# Patient Record
Sex: Female | Born: 1945 | Race: White | Hispanic: No | Marital: Married
Health system: Southern US, Community
[De-identification: ages and names within clinical notes are randomized; demographics above are authoritative.]

## PROBLEM LIST (undated history)

## (undated) DIAGNOSIS — M545 Low back pain, unspecified: Secondary | ICD-10-CM

## (undated) DIAGNOSIS — E039 Hypothyroidism, unspecified: Secondary | ICD-10-CM

## (undated) DIAGNOSIS — F32A Depression, unspecified: Secondary | ICD-10-CM

## (undated) DIAGNOSIS — J90 Pleural effusion, not elsewhere classified: Secondary | ICD-10-CM

## (undated) DIAGNOSIS — J9600 Acute respiratory failure, unspecified whether with hypoxia or hypercapnia: Secondary | ICD-10-CM

## (undated) DIAGNOSIS — E785 Hyperlipidemia, unspecified: Secondary | ICD-10-CM

## (undated) DIAGNOSIS — F329 Major depressive disorder, single episode, unspecified: Secondary | ICD-10-CM

## (undated) DIAGNOSIS — K219 Gastro-esophageal reflux disease without esophagitis: Secondary | ICD-10-CM

## (undated) DIAGNOSIS — G8929 Other chronic pain: Secondary | ICD-10-CM

---

## 2016-01-04 ENCOUNTER — Inpatient Hospital Stay (HOSPITAL_COMMUNITY)
Admission: AD | Admit: 2016-01-04 | Discharge: 2016-01-14 | DRG: 207 | Disposition: E | Payer: Medicare Other | Source: Other Acute Inpatient Hospital | Attending: Pulmonary Disease | Admitting: Pulmonary Disease

## 2016-01-04 ENCOUNTER — Encounter (HOSPITAL_COMMUNITY): Payer: Self-pay | Admitting: Thoracic Surgery (Cardiothoracic Vascular Surgery)

## 2016-01-04 ENCOUNTER — Inpatient Hospital Stay (HOSPITAL_COMMUNITY): Payer: Medicare Other

## 2016-01-04 DIAGNOSIS — G9340 Encephalopathy, unspecified: Secondary | ICD-10-CM | POA: Diagnosis not present

## 2016-01-04 DIAGNOSIS — Z79899 Other long term (current) drug therapy: Secondary | ICD-10-CM | POA: Diagnosis not present

## 2016-01-04 DIAGNOSIS — Z938 Other artificial opening status: Secondary | ICD-10-CM | POA: Insufficient documentation

## 2016-01-04 DIAGNOSIS — R7881 Bacteremia: Secondary | ICD-10-CM

## 2016-01-04 DIAGNOSIS — G8929 Other chronic pain: Secondary | ICD-10-CM | POA: Diagnosis present

## 2016-01-04 DIAGNOSIS — J90 Pleural effusion, not elsewhere classified: Secondary | ICD-10-CM | POA: Diagnosis present

## 2016-01-04 DIAGNOSIS — F419 Anxiety disorder, unspecified: Secondary | ICD-10-CM | POA: Diagnosis present

## 2016-01-04 DIAGNOSIS — J869 Pyothorax without fistula: Secondary | ICD-10-CM | POA: Diagnosis present

## 2016-01-04 DIAGNOSIS — Z515 Encounter for palliative care: Secondary | ICD-10-CM | POA: Diagnosis present

## 2016-01-04 DIAGNOSIS — R21 Rash and other nonspecific skin eruption: Secondary | ICD-10-CM | POA: Diagnosis not present

## 2016-01-04 DIAGNOSIS — J80 Acute respiratory distress syndrome: Secondary | ICD-10-CM | POA: Diagnosis not present

## 2016-01-04 DIAGNOSIS — F0390 Unspecified dementia without behavioral disturbance: Secondary | ICD-10-CM | POA: Diagnosis present

## 2016-01-04 DIAGNOSIS — J9600 Acute respiratory failure, unspecified whether with hypoxia or hypercapnia: Secondary | ICD-10-CM | POA: Diagnosis not present

## 2016-01-04 DIAGNOSIS — E870 Hyperosmolality and hypernatremia: Secondary | ICD-10-CM | POA: Diagnosis not present

## 2016-01-04 DIAGNOSIS — K219 Gastro-esophageal reflux disease without esophagitis: Secondary | ICD-10-CM | POA: Diagnosis present

## 2016-01-04 DIAGNOSIS — Z880 Allergy status to penicillin: Secondary | ICD-10-CM

## 2016-01-04 DIAGNOSIS — J189 Pneumonia, unspecified organism: Secondary | ICD-10-CM | POA: Diagnosis present

## 2016-01-04 DIAGNOSIS — E878 Other disorders of electrolyte and fluid balance, not elsewhere classified: Secondary | ICD-10-CM | POA: Diagnosis not present

## 2016-01-04 DIAGNOSIS — E039 Hypothyroidism, unspecified: Secondary | ICD-10-CM | POA: Diagnosis present

## 2016-01-04 DIAGNOSIS — E876 Hypokalemia: Secondary | ICD-10-CM | POA: Diagnosis present

## 2016-01-04 DIAGNOSIS — E785 Hyperlipidemia, unspecified: Secondary | ICD-10-CM | POA: Diagnosis present

## 2016-01-04 DIAGNOSIS — R627 Adult failure to thrive: Secondary | ICD-10-CM | POA: Diagnosis present

## 2016-01-04 DIAGNOSIS — M545 Low back pain: Secondary | ICD-10-CM | POA: Diagnosis present

## 2016-01-04 DIAGNOSIS — Z66 Do not resuscitate: Secondary | ICD-10-CM | POA: Diagnosis present

## 2016-01-04 DIAGNOSIS — J948 Other specified pleural conditions: Secondary | ICD-10-CM | POA: Diagnosis not present

## 2016-01-04 DIAGNOSIS — F329 Major depressive disorder, single episode, unspecified: Secondary | ICD-10-CM | POA: Diagnosis present

## 2016-01-04 DIAGNOSIS — J9601 Acute respiratory failure with hypoxia: Secondary | ICD-10-CM | POA: Diagnosis not present

## 2016-01-04 DIAGNOSIS — D649 Anemia, unspecified: Secondary | ICD-10-CM | POA: Diagnosis present

## 2016-01-04 DIAGNOSIS — Z9289 Personal history of other medical treatment: Secondary | ICD-10-CM | POA: Insufficient documentation

## 2016-01-04 HISTORY — DX: Hyperlipidemia, unspecified: E78.5

## 2016-01-04 HISTORY — DX: Major depressive disorder, single episode, unspecified: F32.9

## 2016-01-04 HISTORY — DX: Pleural effusion, not elsewhere classified: J90

## 2016-01-04 HISTORY — DX: Low back pain, unspecified: M54.50

## 2016-01-04 HISTORY — DX: Low back pain: M54.5

## 2016-01-04 HISTORY — DX: Gastro-esophageal reflux disease without esophagitis: K21.9

## 2016-01-04 HISTORY — DX: Acute respiratory failure, unspecified whether with hypoxia or hypercapnia: J96.00

## 2016-01-04 HISTORY — DX: Other chronic pain: G89.29

## 2016-01-04 HISTORY — DX: Hypothyroidism, unspecified: E03.9

## 2016-01-04 HISTORY — DX: Depression, unspecified: F32.A

## 2016-01-04 LAB — CBC WITH DIFFERENTIAL/PLATELET
BASOS PCT: 0 %
Basophils Absolute: 0 10*3/uL (ref 0.0–0.1)
EOS PCT: 1 %
Eosinophils Absolute: 0.1 10*3/uL (ref 0.0–0.7)
HEMATOCRIT: 26.7 % — AB (ref 36.0–46.0)
Hemoglobin: 8.3 g/dL — ABNORMAL LOW (ref 12.0–15.0)
LYMPHS ABS: 1 10*3/uL (ref 0.7–4.0)
Lymphocytes Relative: 14 %
MCH: 28.3 pg (ref 26.0–34.0)
MCHC: 31.1 g/dL (ref 30.0–36.0)
MCV: 91.1 fL (ref 78.0–100.0)
MONO ABS: 1.2 10*3/uL — AB (ref 0.1–1.0)
MONOS PCT: 17 %
NEUTROS ABS: 4.7 10*3/uL (ref 1.7–7.7)
Neutrophils Relative %: 68 %
PLATELETS: 158 10*3/uL (ref 150–400)
RBC: 2.93 MIL/uL — ABNORMAL LOW (ref 3.87–5.11)
RDW: 14.8 % (ref 11.5–15.5)
WBC: 7 10*3/uL (ref 4.0–10.5)

## 2016-01-04 LAB — COMPREHENSIVE METABOLIC PANEL
ALK PHOS: 69 U/L (ref 38–126)
ALT: 12 U/L — ABNORMAL LOW (ref 14–54)
AST: 16 U/L (ref 15–41)
Albumin: 1.9 g/dL — ABNORMAL LOW (ref 3.5–5.0)
Anion gap: 8 (ref 5–15)
BILIRUBIN TOTAL: 0.5 mg/dL (ref 0.3–1.2)
BUN: 7 mg/dL (ref 6–20)
CALCIUM: 7.9 mg/dL — AB (ref 8.9–10.3)
CO2: 28 mmol/L (ref 22–32)
CREATININE: 0.71 mg/dL (ref 0.44–1.00)
Chloride: 105 mmol/L (ref 101–111)
GFR calc Af Amer: >60 mL/min (ref >60)
GLUCOSE: 96 mg/dL (ref 65–99)
POTASSIUM: 2.8 mmol/L — AB (ref 3.5–5.1)
Sodium: 141 mmol/L (ref 135–145)
TOTAL PROTEIN: 5.6 g/dL — AB (ref 6.5–8.1)

## 2016-01-04 LAB — PHOSPHORUS: Phosphorus: 3.6 mg/dL (ref 2.5–4.6)

## 2016-01-04 LAB — LIPASE, BLOOD: Lipase: 18 U/L (ref 11–51)

## 2016-01-04 LAB — TRIGLYCERIDES: Triglycerides: 108 mg/dL (ref <150)

## 2016-01-04 LAB — GLUCOSE, CAPILLARY
GLUCOSE-CAPILLARY: 99 mg/dL (ref 65–99)
GLUCOSE-CAPILLARY: 81 mg/dL (ref 65–99)

## 2016-01-04 LAB — POCT I-STAT 3, ART BLOOD GAS (G3+)
Acid-Base Excess: 5 mmol/L — ABNORMAL HIGH (ref 0.0–2.0)
Bicarbonate: 29.1 mEq/L — ABNORMAL HIGH (ref 20.0–24.0)
O2 SAT: 94 %
PCO2 ART: 38.8 mmHg (ref 35.0–45.0)
PH ART: 7.485 — AB (ref 7.350–7.450)
PO2 ART: 68 mmHg — AB (ref 80.0–100.0)
Patient temperature: 99.9
TCO2: 30 mmol/L (ref 0–100)

## 2016-01-04 LAB — TYPE AND SCREEN
ABO/RH(D): O POS
Antibody Screen: NEGATIVE

## 2016-01-04 LAB — CORTISOL: Cortisol, Plasma: 11.5 ug/dL

## 2016-01-04 LAB — STREP PNEUMONIAE URINARY ANTIGEN: STREP PNEUMO URINARY ANTIGEN: NEGATIVE

## 2016-01-04 LAB — PROTIME-INR
INR: 1.35 (ref 0.00–1.49)
Prothrombin Time: 16.8 seconds — ABNORMAL HIGH (ref 11.6–15.2)

## 2016-01-04 LAB — APTT: APTT: 49 s — AB (ref 24–37)

## 2016-01-04 LAB — ABO/RH: ABO/RH(D): O POS

## 2016-01-04 LAB — PROCALCITONIN: Procalcitonin: 1.01 ng/mL

## 2016-01-04 LAB — MRSA PCR SCREENING: MRSA by PCR: NEGATIVE

## 2016-01-04 LAB — MAGNESIUM: MAGNESIUM: 2 mg/dL (ref 1.7–2.4)

## 2016-01-04 MED ORDER — MIDAZOLAM HCL 2 MG/2ML IJ SOLN
1.0000 mg | INTRAMUSCULAR | Status: DC | PRN
  Administered 2016-01-05 (×2): 1 mg via INTRAVENOUS

## 2016-01-04 MED ORDER — LEVOTHYROXINE SODIUM 50 MCG PO TABS
50.0000 ug | ORAL_TABLET | Freq: Every day | ORAL | Status: DC
  Administered 2016-01-05 – 2016-01-10 (×6): 50 ug
  Filled 2016-01-04 (×6): qty 1

## 2016-01-04 MED ORDER — FAMOTIDINE IN NACL 20-0.9 MG/50ML-% IV SOLN
20.0000 mg | Freq: Two times a day (BID) | INTRAVENOUS | Status: DC
  Administered 2016-01-04 – 2016-01-09 (×11): 20 mg via INTRAVENOUS
  Filled 2016-01-04 (×13): qty 50

## 2016-01-04 MED ORDER — SERTRALINE HCL 100 MG PO TABS
100.0000 mg | ORAL_TABLET | Freq: Every day | ORAL | Status: DC
  Administered 2016-01-05 – 2016-01-10 (×6): 100 mg
  Filled 2016-01-04 (×6): qty 1

## 2016-01-04 MED ORDER — ANTISEPTIC ORAL RINSE SOLUTION (CORINZ)
7.0000 mL | Freq: Four times a day (QID) | OROMUCOSAL | Status: DC
  Administered 2016-01-05 – 2016-01-06 (×6): 7 mL via OROMUCOSAL

## 2016-01-04 MED ORDER — VANCOMYCIN HCL IN DEXTROSE 750-5 MG/150ML-% IV SOLN
750.0000 mg | Freq: Two times a day (BID) | INTRAVENOUS | Status: DC
  Filled 2016-01-04 (×2): qty 150

## 2016-01-04 MED ORDER — ANTISEPTIC ORAL RINSE SOLUTION (CORINZ)
7.0000 mL | Freq: Four times a day (QID) | OROMUCOSAL | Status: DC
  Administered 2016-01-04: 7 mL via OROMUCOSAL

## 2016-01-04 MED ORDER — FENTANYL CITRATE (PF) 100 MCG/2ML IJ SOLN
50.0000 ug | INTRAMUSCULAR | Status: DC | PRN
  Administered 2016-01-05 – 2016-01-10 (×7): 50 ug via INTRAVENOUS
  Filled 2016-01-04 (×8): qty 2

## 2016-01-04 MED ORDER — ALBUTEROL SULFATE (2.5 MG/3ML) 0.083% IN NEBU
2.5000 mg | INHALATION_SOLUTION | RESPIRATORY_TRACT | Status: DC
  Administered 2016-01-04 – 2016-01-10 (×35): 2.5 mg via RESPIRATORY_TRACT
  Filled 2016-01-04 (×35): qty 3

## 2016-01-04 MED ORDER — INSULIN ASPART 100 UNIT/ML ~~LOC~~ SOLN
0.0000 [IU] | SUBCUTANEOUS | Status: DC
  Administered 2016-01-06 – 2016-01-09 (×13): 1 [IU] via SUBCUTANEOUS

## 2016-01-04 MED ORDER — MIDAZOLAM HCL 2 MG/2ML IJ SOLN
1.0000 mg | INTRAMUSCULAR | Status: DC | PRN
Start: 2016-01-04 — End: 2016-01-10
  Administered 2016-01-07: 1 mg via INTRAVENOUS
  Filled 2016-01-04 (×2): qty 2

## 2016-01-04 MED ORDER — FENTANYL CITRATE (PF) 100 MCG/2ML IJ SOLN
50.0000 ug | INTRAMUSCULAR | Status: AC | PRN
  Administered 2016-01-05 (×3): 50 ug via INTRAVENOUS
  Filled 2016-01-04: qty 2

## 2016-01-04 MED ORDER — VITAL HIGH PROTEIN PO LIQD
1000.0000 mL | ORAL | Status: DC
  Administered 2016-01-04: 1000 mL

## 2016-01-04 MED ORDER — VANCOMYCIN HCL IN DEXTROSE 750-5 MG/150ML-% IV SOLN
750.0000 mg | Freq: Two times a day (BID) | INTRAVENOUS | Status: DC
  Administered 2016-01-04: 750 mg via INTRAVENOUS
  Filled 2016-01-04 (×3): qty 150

## 2016-01-04 MED ORDER — PRO-STAT SUGAR FREE PO LIQD
30.0000 mL | Freq: Two times a day (BID) | ORAL | Status: DC
  Administered 2016-01-04 – 2016-01-10 (×13): 30 mL
  Filled 2016-01-04 (×12): qty 30

## 2016-01-04 MED ORDER — POTASSIUM CHLORIDE 10 MEQ/50ML IV SOLN
10.0000 meq | INTRAVENOUS | Status: AC
  Administered 2016-01-04 – 2016-01-05 (×6): 10 meq via INTRAVENOUS
  Filled 2016-01-04 (×6): qty 50

## 2016-01-04 MED ORDER — CHLORHEXIDINE GLUCONATE 0.12% ORAL RINSE (MEDLINE KIT)
15.0000 mL | Freq: Two times a day (BID) | OROMUCOSAL | Status: DC
  Administered 2016-01-04 – 2016-01-06 (×4): 15 mL via OROMUCOSAL

## 2016-01-04 MED ORDER — HEPARIN SODIUM (PORCINE) 5000 UNIT/ML IJ SOLN
5000.0000 [IU] | Freq: Three times a day (TID) | INTRAMUSCULAR | Status: DC
  Filled 2016-01-04: qty 1

## 2016-01-04 MED ORDER — PROPOFOL 1000 MG/100ML IV EMUL
0.0000 ug/kg/min | INTRAVENOUS | Status: DC
  Administered 2016-01-04: 35 ug/kg/min via INTRAVENOUS
  Administered 2016-01-04: 30 ug/kg/min via INTRAVENOUS
  Administered 2016-01-05 (×2): 20 ug/kg/min via INTRAVENOUS
  Administered 2016-01-05: 30 ug/kg/min via INTRAVENOUS
  Administered 2016-01-06: 40 ug/kg/min via INTRAVENOUS
  Administered 2016-01-06: 30 ug/kg/min via INTRAVENOUS
  Administered 2016-01-06 – 2016-01-07 (×3): 20 ug/kg/min via INTRAVENOUS
  Filled 2016-01-04 (×11): qty 100

## 2016-01-04 MED ORDER — SODIUM CHLORIDE 0.9 % IV SOLN
500.0000 mg | Freq: Three times a day (TID) | INTRAVENOUS | Status: DC
  Administered 2016-01-04 – 2016-01-05 (×2): 500 mg via INTRAVENOUS
  Filled 2016-01-04 (×4): qty 500

## 2016-01-04 MED ORDER — SODIUM CHLORIDE 0.9 % IV SOLN
250.0000 mL | INTRAVENOUS | Status: DC | PRN
  Administered 2016-01-06: 250 mL via INTRAVENOUS

## 2016-01-04 MED ORDER — CHLORHEXIDINE GLUCONATE 0.12% ORAL RINSE (MEDLINE KIT): 15.0000 mL | Freq: Two times a day (BID) | OROMUCOSAL | Status: DC

## 2016-01-04 MED ORDER — SODIUM CHLORIDE 0.9 % IV SOLN
INTRAVENOUS | Status: DC
  Administered 2016-01-04 – 2016-01-05 (×2): via INTRAVENOUS
  Administered 2016-01-06: 75 mL/h via INTRAVENOUS

## 2016-01-04 NOTE — Progress Notes (Addendum)
Pharmacy Antibiotic Note  Breanna [name] is a 70 y.o. female  transferred on 01/05/2016 with pneumonia.  Pharmacy has been consulted for Vancomycin and Primaxin dosing.  70 year old female admitted to [facility] 6/16 with SOB, cough, congestion.  Had been at home for 3 days with these symptoms.  History of Gerd, HLD, depression, hypothyroidism  At [facility], she was initially treated with Rocephin and Zithromax with worsening of her condition.  Her condition worsened and was transitioned to Levaquin, Meropenem, and Vancomycin.  She was placed on bipap then intubated and transferred to [facility] 6/21  Vancomycin 1250 mg iv X 1 dose given 6/20 at 12 noon, then placed on Vancomycin 1 gram iv Q 18 hours (last dose 6/21 at 6 am) Last doses of Meropenem and Levaquin 6/20 pm   Last Scr at [facility] = 0.7  Plan: Increase Vancomycin to 750 mg iv Q 12 hours (next dose at 2200 pm) Primaxin 500 mg iv Q 8 hours Follow up progress, Scr, culture results  Height: 5\' 7"  (170.2 cm) Weight: 195 lb 15.8 oz (88.9 kg) IBW/kg (Calculated) : 61.6  Temp (24hrs), Avg:99.9 F (37.7 C), Min:99.9 F (37.7 C), Max:99.9 F (37.7 C)  No results for input(s): WBC, CREATININE, LATICACIDVEN, VANCOTROUGH, VANCOPEAK, VANCORANDOM, GENTTROUGH, GENTPEAK, GENTRANDOM, TOBRATROUGH, TOBRAPEAK, TOBRARND, AMIKACINPEAK, AMIKACINTROU, AMIKACIN in the last 168 hours.  CrCl cannot be calculated (Patient has no serum creatinine result on file.).    Allergies  Allergen Reactions  . Penicillins      Thank you Okey RegalLisa Shernell Saldierna, PharmD [phone]  12/17/2015 4:16 PM

## 2016-01-04 NOTE — Progress Notes (Signed)
50cc of fentanyl drip wasted in sink from [facility]. Witnessed by Constellation BrandsChristian RN.

## 2016-01-04 NOTE — H&P (Signed)
PULMONARY / CRITICAL CARE MEDICINE   Name: Breanna [name] MRN: [id number] DOB: 04/11/1946    ADMISSION DATE:  12/19/2015 CONSULTATION DATE:  01/11/2016  CHIEF COMPLAINT:  Treatment for progressing acute respiratory failure, pneumonia and empyema.  HISTORY OF PRESENT ILLNESS:   70 yo woman with history of GERD, hyperlipidemia, chronic low back pain, hypothyroidism and depression. She was originally seen outpatient on 12/30/15 for severe fatigue, shortness of breath, difficulty taking a deep breath and cough and congestion. She was admitted on 6/16 for inpatient treatment at [facility] and initially treated with Rocephin and Zithromax, however condition continued to worsen. Antibiotics were then changed to Levaquin, Vancomycin and Meropenum. Condition continue to worsen and she required BiPap and transfer to the ICU. Once in the ICU she was then intubated and transferred to [facility] for further treatment and workup.   PAST MEDICAL HISTORY :  She  has no past medical history on file.  PAST SURGICAL HISTORY: She  has no past surgical history on file.  Allergies  Allergen Reactions  . Penicillins     No current facility-administered medications on file prior to encounter.   No current outpatient prescriptions on file prior to encounter.    FAMILY HISTORY:  Her has no family status information on file.   SOCIAL HISTORY: She    REVIEW OF SYSTEMS:   Negative except HPI.   SUBJECTIVE:  Sedated on vent VITAL SIGNS: BP 136/71 mmHg  Pulse 76  Temp(Src) 99.9 F (37.7 C) (Oral)  Resp 26  Ht 5\' 7"  (1.702 m)  Wt 195 lb 15.8 oz (88.9 kg)  BMI 30.69 kg/m2  SpO2 93%  HEMODYNAMICS:    VENTILATOR SETTINGS: Vent Mode:  [-] PRVC FiO2 (%):  [60 %] 60 % Set Rate:  [20 bmp-26 bmp] 20 bmp Vt Set:  [500 mL] 500 mL PEEP:  [5 cmH20-10 cmH20] 10 cmH20 Plateau Pressure:  [25 cmH20] 25 cmH20  INTAKE / OUTPUT:    PHYSICAL EXAMINATION: General:  Ill looking elderly female Neuro:   Sedated HEENT: Moist mucous membranes Cardiovascular:  No JVD, S1S2, no murmurs Lungs:  Rhonchi throughout, diminished breath sounds in bases L>R, Intubated Abdomen: Obese, BS x 4 quadrants, OGT in place Musculoskeletal:  Sedated, no moving to command Skin: warm and intact  LABS:  BMET No results for input(s): NA, K, CL, CO2, BUN, CREATININE, GLUCOSE in the last 168 hours.  Electrolytes No results for input(s): CALCIUM, MG, PHOS in the last 168 hours.  CBC No results for input(s): WBC, HGB, HCT, PLT in the last 168 hours.  Coag's No results for input(s): APTT, INR in the last 168 hours.  Sepsis Markers No results for input(s): LATICACIDVEN, PROCALCITON, O2SATVEN in the last 168 hours.  ABG  Recent Labs Lab 01/01/2016 1542  PHART 7.485*  PCO2ART 38.8  PO2ART 68.0*    Liver Enzymes No results for input(s): AST, ALT, ALKPHOS, BILITOT, ALBUMIN in the last 168 hours.  Cardiac Enzymes No results for input(s): TROPONINI, PROBNP in the last 168 hours.  Glucose No results for input(s): GLUCAP in the last 168 hours.  Imaging No results found.   STUDIES:  CT chest OSH - multifocal pneumonia with possible early empyema on the left side of the chest.  CT abd/Pelvis OSH: no abnormalities V/Q scan OSH: intermediate probability of PE  CULTURES: None  ANTIBIOTICS: Levaquin 500 mg IV daily 6/19 >>> 6/21 Vancomycin 1 gram (per pharmacy dosing) 6/20 >>> Meropenum 1 gram IV q 12 6/19 >>> 6/21 Imipenem  6/21 >>.  SIGNIFICANT EVENTS: January 19, 2016: [facility] [place] [facility]   LINES/TUBES: ETT Foley  DISCUSSION: 70 yo woman with history of GERD, hyperlipidemia, chronic low back pain, hypothyroidism and depression. Admitted to [facility] for evaluation and treatment of empyema and pneumonia.   ASSESSMENT / PLAN:  PULMONARY A: Empyema Acute Respiratory Failure Pneumonia P:   Continue abx  Possible chest tube placement for drainage of empyema versus  VATS If CT placed may be DNase/TPA candidate  CXR/ABG in the AM VAP bundle Wean vent as tolerated  CARDIOVASCULAR A:  Hyperlipidemia P:  Atorvastatin 40 mg nightly - restart once enteral nutrition started Maintain MAP > 65  RENAL A:   Foley in place P:   Continue to monitor UOP Goal euvolemia  GASTROINTESTINAL A:   GERD P:   Pepcid for GI prophylaxis TF per nutrition    HEMATOLOGIC A:   Anemia DVT prophylaxis P:  SCD for DVT prophylaxis Holding chemoprophylaxis at this time (CT versus surgery) Holding Iron in the setting of critical illness Further anemia workup when stable  INFECTIOUS A:   Leukocytosis Empyema P:   Stop Levaquin (received 5 days of atypical coverage) Continue Vancomycin  Start Imipenem Monitor WBC and fever curve F/U strep antigen F/U sputum culture/gram stain  ENDOCRINE A:   Blood glucose monitoring P:   Add SSI CBG Q 4 hours    NEUROLOGIC A:   Sedated P:   RASS goal: -1 Fentanyl/Versed prn Propofol gtt   FAMILY  - Updates:   - Inter-disciplinary family meet or Palliative Care meeting due by:  01/11/16   [name] ACNP-BC [facility] Pulmonary/Critical Care Pager # [phone] OR # [phone] if no answer   01/19/2016, 4:12 PM   Attending Note:  70 year old female with extensive PMH who presents to [facility] with SOB and was noted to have a left sided pneumonia. Patient subsequently was intubated for hypoxemic respiratory failure. Over the following days the patient developed pleural effusion and failed to respond. Was transferred to [facility] for pulmonary to evaluate. The patient is currently sedated, unresponsive on propofol. Diminished BS on the left with dullness to percussion. Will recheck ABG now and adjust vent. Continue primaxin and vanc, already had 5 days in zithromax. F/u on culture. Consult CVTS, ?VATS vs bedside CT as when I reviewed the CT myself there is loculation. Will keep on propofol today and  change to versed tomorrow after decision is made on surgery for VATS or not.  The patient is critically ill with multiple organ systems failure and requires high complexity decision making for assessment and support, frequent evaluation and titration of therapies, application of advanced monitoring technologies and extensive interpretation of multiple databases.   Critical Care Time devoted to patient care services described in this note is 35 Minutes. This time reflects time of care of this signee Dr Koren Bound. This critical care time does not reflect procedure time, or teaching time or supervisory time of PA/NP/Med student/Med Resident etc but could involve care discussion time.  Alyson Reedy, M.D. [facility] Pulmonary/Critical Care Medicine. Pager: [phone]. After hours pager: [phone].

## 2016-01-04 NOTE — Progress Notes (Signed)
eLink Physician-Brief Progress Note Patient Name: Breanna [name] DOB: 1946-02-07 MRN: [id number]   Date of Service  01/01/2016  HPI/Events of Note  K+ = 2.8 and Creatinine = 0.71.  eICU Interventions  Will replace K+.     Intervention Category Intermediate Interventions: Electrolyte abnormality - evaluation and management  Lenell AntuSommer,Jaeceon Michelin Eugene 12/25/2015, 7:36 PM

## 2016-01-04 NOTE — Consult Note (Addendum)
[address]       [place] [address]             [phone]          CARDIOTHORACIC SURGERY CONSULTATION REPORT  PCP is Neale Burly, MD Referring Provider is Rush Farmer, MD  Reason for consultation:  Left pleural effusion  HPI:  Patient is a 70 year old obese white female with reported history of GE reflux disease, hyperlipidemia, chronic low back pain, hypothyroidism, and depression. A comprehensive review of the patient's past medical history is not currently possible as records are incomplete. By report the patient was extended to [facility] 12/30/2015 with complaints of fatigue, shortness of breath, difficulty taking a deep breath, cough, and congestion. She was initially treated with intravenous Rocephin and Zithromax but her condition deteriorated. Antibiotics were broadened to include Levaquin, vancomycin, and meropenem. A chest x-ray and CT scan were performed that revealed severe bilateral pneumonia with moderate sized left pleural effusion. She developed acute respiratory failure requiring mechanical ventilatory support and was transferred to [facility] for further management.  She was evaluated by Dr. Nelda Marseille and cardiothoracic surgical consultation was requested to consider chest tube placement versus VATS.  Upon my arrival the patient is intubated and heavily sedated on ventilator. The patient has been made DO NOT RESUSCITATE.  The patient's family recently left for the evening.    Past Medical History  Diagnosis Date  . Pleural effusion, left   . Acute respiratory failure ([place]) 12/20/2015    No past surgical history on file.  No family history on file.  Social History   Social History  . Marital Status: Married    Spouse Name: N/A  . Number of Children: N/A  . Years of Education: N/A   Occupational History  . Not on file.   Social History Main Topics  . Smoking status: Not on file  . Smokeless tobacco: Not  on file  . Alcohol Use: Not on file  . Drug Use: Not on file  . Sexual Activity: Not on file   Other Topics Concern  . Not on file   Social History Narrative  . No narrative on file    Prior to Admission medications   Medication Sig Start Date End Date Taking? Authorizing Provider  atorvastatin (LIPITOR) 40 MG tablet Take 40 mg by mouth daily at 6 PM.    Historical Provider, MD  gabapentin (NEURONTIN) 300 MG capsule Take 300 mg by mouth 2 (two) times daily.    Historical Provider, MD  HYDROcodone-acetaminophen (NORCO) 7.5-325 MG tablet Take 1 tablet by mouth every 6 (six) hours as needed for moderate pain.    Historical Provider, MD  lansoprazole (PREVACID) 15 MG capsule Take 15 mg by mouth 2 (two) times daily before a meal.    Historical Provider, MD  levothyroxine (SYNTHROID, LEVOTHROID) 50 MCG tablet Take 50 mcg by mouth daily before breakfast.    Historical Provider, MD  sertraline (ZOLOFT) 100 MG tablet Take 100 mg by mouth daily.    Historical Provider, MD    Current Facility-Administered Medications  Medication Dose Route Frequency Provider Last Rate Last Dose  . 0.9 %  sodium chloride infusion  250 mL Intravenous PRN Erick Colace, NP      . 0.9 %  sodium chloride infusion   Intravenous Continuous Erick Colace, NP 75 mL/hr at 01/02/2016 1554    . albuterol (PROVENTIL) (2.5 MG/3ML) 0.083% nebulizer solution  2.5 mg  2.5 mg Nebulization Q4H Erick Colace, NP   2.5 mg at 12/17/2015 1625  . antiseptic oral rinse solution (CORINZ)  7 mL Mouth Rinse QID Erick Colace, NP   0 mL at 12/26/2015 1600  . chlorhexidine gluconate (SAGE KIT) (PERIDEX) 0.12 % solution 15 mL  15 mL Mouth Rinse BID Erick Colace, NP      . famotidine (PEPCID) IVPB 20 mg premix  20 mg Intravenous Q12H Erick Colace, NP      . feeding supplement (PRO-STAT SUGAR FREE 64) liquid 30 mL  30 mL Per Tube BID Erick Colace, NP      . feeding supplement (VITAL HIGH PROTEIN) liquid 1,000 mL  1,000 mL Per Tube Q24H  Erick Colace, NP      . fentaNYL (SUBLIMAZE) injection 50 mcg  50 mcg Intravenous Q15 min PRN Erick Colace, NP      . fentaNYL (SUBLIMAZE) injection 50 mcg  50 mcg Intravenous Q2H PRN Erick Colace, NP      . imipenem-cilastatin (PRIMAXIN) 500 mg in sodium chloride 0.9 % 100 mL IVPB  500 mg Intravenous Q8H Rush Farmer, MD   500 mg at 01/05/2016 1803  . insulin aspart (novoLOG) injection 0-9 Units  0-9 Units Subcutaneous Q4H Erick Colace, NP   0 Units at 01/01/2016 1600  . [START ON 01/05/2016] levothyroxine (SYNTHROID, LEVOTHROID) tablet 50 mcg  50 mcg Per Tube QAC breakfast Erick Colace, NP      . midazolam (VERSED) injection 1 mg  1 mg Intravenous Q15 min PRN Awilda Bill, NP      . midazolam (VERSED) injection 1 mg  1 mg Intravenous Q2H PRN Awilda Bill, NP      . propofol (DIPRIVAN) 1000 MG/100ML infusion  0-50 mcg/kg/min Intravenous Continuous Erick Colace, NP 18.7 mL/hr at 01/05/2016 1809 35 mcg/kg/min at 12/16/2015 1809  . [START ON 01/05/2016] sertraline (ZOLOFT) tablet 100 mg  100 mg Per Tube Daily Erick Colace, NP      . vancomycin (VANCOCIN) IVPB 750 mg/150 ml premix  750 mg Intravenous Q12H Rush Farmer, MD        Allergies  Allergen Reactions  . Penicillins       Review of Systems:  Not obtainable    Physical Exam:   BP 105/52 mmHg  Pulse 58  Temp(Src) 99.9 F (37.7 C) (Oral)  Resp 20  Ht _0  (1.702 m)  Wt 195 lb 15.8 oz (88.9 kg)  BMI 30.69 kg/m2  SpO2 97%  General:  Obese white female sedated on ventilator  HEENT:  Unremarkable   Neck:   no JVD, no bruits, no adenopathy   Chest:   Coarse rhonchi both lungs  CV:   RRR, no murmur   Abdomen:  soft, non-tender, no masses   Extremities:  warm, well-perfused, pulses diminished, adequate perfusion  Rectal/GU  Deferred  Neuro:   Grossly non-focal and symmetrical throughout  Skin:   Clean and dry, no rashes, no breakdown  Diagnostic Tests:  PORTABLE CHEST 1 VIEW  COMPARISON:  None.  FINDINGS: Endotracheal tube terminates 4.2 cm above carina.Nasogastric terminates at the body of the stomach. Left internal jugular line tip at mid SVC.  Mild cardiomegaly. Moderate left-sided pleural effusion. Mild right hemidiaphragm elevation. No pneumothorax. Patchy interstitial and airspace disease bilaterally. More dense consolidation in the left lower lobe.  IMPRESSION: Appropriate position of endotracheal, nasogastric, and left internal jugular lines,  without pneumothorax.  Moderate left pleural effusion with adjacent atelectasis or infection. Followup PA and lateral chest X-ray is recommended in 3-4 weeks following trial of antibiotic therapy to ensure resolution and exclude underlying malignancy.  Other patchy interstitial and airspace opacities which could represent pulmonary edema and/or infection.   Electronically Signed  By: Abigail Miyamoto M.D.  On: 12/26/2015 17:25    Impression:  I have personally reviewed the chest CT scan performed that [facility] which reveals severe diffuse bilateral pneumonia and a small to moderate sized left pleural effusion.  There are no obvious loculations visible on this CT scan.  Although the patient would likely benefit from drainage of the pleural effusion, the effusion is not very large and clearly not the primary cause of the patient's respiratory failure.    Plan:  The patient's family recently left the hospital and are not currently available for consultation.  At this point I would favor bedside chest tube placement rather than taking this patient for surgery if the effusion is to be drained.  I attempted to page both Dr. Nelda Marseille and Marni Griffon to inquire regarding the patient's past medical history, CODE STATUS and goals for treatment.  No answer.  Will follow up in am tomorrow.   I spent in excess of 30 minutes during the conduct of this hospital consultation and >50% of this time involved direct  face-to-face encounter for counseling and/or coordination of the patient's care.   Valentina Gu. Roxy Manns, MD 01/08/2016 6:12 PM

## 2016-01-04 NOTE — Procedures (Signed)
Central Venous Catheter Insertion Procedure Note Breanna KosMary [name] [id number] [birth year]  Procedure: Insertion of Central Venous Catheter Indications: Assessment of intravascular volume and Drug and/or fluid administration  Procedure Details Consent: Unable to obtain consent because of emergent medical necessity. Time Out: Verified patient identification, verified procedure, site/side was marked, verified correct patient position, special equipment/implants available, medications/allergies/relevent history reviewed, required imaging and test results available.  Performed  Maximum sterile technique was used including antiseptics, cap, gloves, gown, hand hygiene, mask and sheet. Skin prep: Chlorhexidine; local anesthetic administered A antimicrobial bonded/coated triple lumen catheter was placed in the left internal jugular vein using the Seldinger technique.  Evaluation Blood flow good Complications: No apparent complications Patient did tolerate procedure well. Chest X-ray ordered to verify placement.  CXR: pending.  U/S used in placement.  YACOUB,WESAM 07/26/15, 4:49 PM

## 2016-01-05 ENCOUNTER — Encounter (HOSPITAL_COMMUNITY): Payer: Self-pay

## 2016-01-05 ENCOUNTER — Inpatient Hospital Stay (HOSPITAL_COMMUNITY): Payer: Medicare Other

## 2016-01-05 DIAGNOSIS — Z938 Other artificial opening status: Secondary | ICD-10-CM | POA: Insufficient documentation

## 2016-01-05 DIAGNOSIS — J948 Other specified pleural conditions: Secondary | ICD-10-CM

## 2016-01-05 DIAGNOSIS — J189 Pneumonia, unspecified organism: Secondary | ICD-10-CM

## 2016-01-05 LAB — MAGNESIUM
MAGNESIUM: 2.1 mg/dL (ref 1.7–2.4)
MAGNESIUM: 2.1 mg/dL (ref 1.7–2.4)

## 2016-01-05 LAB — PROCALCITONIN: PROCALCITONIN: 0.74 ng/mL

## 2016-01-05 LAB — BLOOD GAS, ARTERIAL
Acid-Base Excess: 4.1 mmol/L — ABNORMAL HIGH (ref 0.0–2.0)
Bicarbonate: 27.9 mEq/L — ABNORMAL HIGH (ref 20.0–24.0)
DRAWN BY: 36496
FIO2: 0.6
O2 SAT: 96 %
PATIENT TEMPERATURE: 98.6
PCO2 ART: 40.1 mmHg (ref 35.0–45.0)
PEEP: 10 cmH2O
PH ART: 7.456 — AB (ref 7.350–7.450)
PO2 ART: 81.4 mmHg (ref 80.0–100.0)
RATE: 20 resp/min
TCO2: 29.1 mmol/L (ref 0–100)
VT: 500 mL

## 2016-01-05 LAB — BASIC METABOLIC PANEL
Anion gap: 5 (ref 5–15)
BUN: 12 mg/dL (ref 6–20)
CALCIUM: 8 mg/dL — AB (ref 8.9–10.3)
CHLORIDE: 108 mmol/L (ref 101–111)
CO2: 28 mmol/L (ref 22–32)
CREATININE: 0.62 mg/dL (ref 0.44–1.00)
GFR calc non Af Amer: >60 mL/min (ref >60)
Glucose, Bld: 121 mg/dL — ABNORMAL HIGH (ref 65–99)
Potassium: 3.3 mmol/L — ABNORMAL LOW (ref 3.5–5.1)
Sodium: 141 mmol/L (ref 135–145)

## 2016-01-05 LAB — GLUCOSE, CAPILLARY
GLUCOSE-CAPILLARY: 102 mg/dL — AB (ref 65–99)
GLUCOSE-CAPILLARY: 107 mg/dL — AB (ref 65–99)
GLUCOSE-CAPILLARY: 115 mg/dL — AB (ref 65–99)
GLUCOSE-CAPILLARY: 93 mg/dL (ref 65–99)
Glucose-Capillary: 116 mg/dL — ABNORMAL HIGH (ref 65–99)
Glucose-Capillary: 106 mg/dL — ABNORMAL HIGH (ref 65–99)

## 2016-01-05 LAB — CBC
HEMATOCRIT: 27.2 % — AB (ref 36.0–46.0)
HEMOGLOBIN: 8.2 g/dL — AB (ref 12.0–15.0)
MCH: 27.2 pg (ref 26.0–34.0)
MCHC: 30.1 g/dL (ref 30.0–36.0)
MCV: 90.4 fL (ref 78.0–100.0)
Platelets: 155 10*3/uL (ref 150–400)
RBC: 3.01 MIL/uL — ABNORMAL LOW (ref 3.87–5.11)
RDW: 14.9 % (ref 11.5–15.5)
WBC: 6.8 10*3/uL (ref 4.0–10.5)

## 2016-01-05 LAB — URINE CULTURE: CULTURE: NO GROWTH

## 2016-01-05 LAB — PATHOLOGIST SMEAR REVIEW

## 2016-01-05 LAB — PHOSPHORUS
PHOSPHORUS: 2.9 mg/dL (ref 2.5–4.6)
PHOSPHORUS: 3.2 mg/dL (ref 2.5–4.6)

## 2016-01-05 LAB — PROTIME-INR
INR: 1.32 (ref 0.00–1.49)
Prothrombin Time: 16.5 seconds — ABNORMAL HIGH (ref 11.6–15.2)

## 2016-01-05 LAB — APTT: APTT: 28 s (ref 24–37)

## 2016-01-05 MED ORDER — LIDOCAINE HCL (PF) 1 % IJ SOLN
INTRAMUSCULAR | Status: AC
  Administered 2016-01-05: 14:00:00
  Filled 2016-01-05: qty 5

## 2016-01-05 MED ORDER — POTASSIUM CHLORIDE 20 MEQ/15ML (10%) PO SOLN
40.0000 meq | Freq: Once | ORAL | Status: AC
  Administered 2016-01-05: 40 meq
  Filled 2016-01-05: qty 30

## 2016-01-05 MED ORDER — SODIUM CHLORIDE 0.9 % IV SOLN
500.0000 mg | Freq: Four times a day (QID) | INTRAVENOUS | Status: DC
  Administered 2016-01-05 – 2016-01-09 (×16): 500 mg via INTRAVENOUS
  Filled 2016-01-05 (×17): qty 500

## 2016-01-05 MED ORDER — VITAL HIGH PROTEIN PO LIQD
1000.0000 mL | ORAL | Status: DC
  Administered 2016-01-05: 1000 mL
  Administered 2016-01-07 (×4)
  Administered 2016-01-07 (×2): 1000 mL
  Administered 2016-01-07: 19:00:00
  Administered 2016-01-08: 1000 mL
  Administered 2016-01-08 (×3)
  Administered 2016-01-09: 1000 mL
  Filled 2016-01-05: qty 1000

## 2016-01-05 MED ORDER — LIDOCAINE HCL (PF) 1 % IJ SOLN
INTRAMUSCULAR | Status: AC
  Administered 2016-01-05: 14:00:00
  Filled 2016-01-05: qty 10

## 2016-01-05 MED ORDER — VANCOMYCIN HCL IN DEXTROSE 1-5 GM/200ML-% IV SOLN
1000.0000 mg | Freq: Two times a day (BID) | INTRAVENOUS | Status: DC
  Administered 2016-01-05 – 2016-01-08 (×8): 1000 mg via INTRAVENOUS
  Filled 2016-01-05 (×9): qty 200

## 2016-01-05 MED ORDER — ATORVASTATIN CALCIUM 40 MG PO TABS
40.0000 mg | ORAL_TABLET | Freq: Every day | ORAL | Status: DC
  Administered 2016-01-05 – 2016-01-09 (×5): 40 mg via ORAL
  Filled 2016-01-05 (×5): qty 1

## 2016-01-05 NOTE — Progress Notes (Addendum)
Pharmacy Antibiotic Note  Breanna [name] is a 70 y.o. female  transferred on 12/27/2015 with empyema and pneumonia.  Pharmacy has been consulted for Vancomycin and Primaxin dosing.  PMH GERD, HLD, depression, hypothyroidism  Minimal clinical improvement on ceftriaxone and [facility]ithromycin at [facility], transitioned to levofloxacin, meropenem, and vancomycin prior to [facility]. Continuing vancomycin and imipenem during this admission.  WBC wnl, afebrile. Chest tube to be placed for empyema drainage. Cr 0.62, CrCl ~75 ml/min. Adjusting abx to ensure optimal dosing.   Plan: Increase vancomycin to 1000 mg q12h Increase imipenem to 500 mg q6h Monitor renal function, s/sx clinical improvement F/u cx results, VT at steady state or as indicated  Height: 5\' 7"  (170.2 cm) Weight: 197 lb 8.5 oz (89.6 kg) IBW/kg (Calculated) : 61.6  Temp (24hrs), Avg:99.2 F (37.3 C), Min:97.7 F (36.5 C), Max:100 F (37.8 C)   Recent Labs Lab 01/01/2016 1805 01/05/16 0425  WBC 7.0 6.8  CREATININE 0.71 0.62    Estimated Creatinine Clearance: 75.2 mL/min (by C-G formula based on Cr of 0.62).    Allergies  Allergen Reactions  . Penicillins     Has patient had a PCN reaction causing immediate rash, facial/tongue/throat swelling, SOB or lightheadedness with hypotension: YES Has patient had a PCN reaction causing severe rash involving mucus membranes or skin necrosis: NO Has patient had a PCN reaction that required hospitalization NO Has patient had a PCN reaction occurring within the last 10 years: NO If all of the above answers are "NO", then may proceed with Cephalosporin use.      Hillery AldoElizabeth Armany Mano, VermontPharm.D., BCPS PGY2 Cardiology Pharmacy Resident Pager: [phone] 01/05/2016 1:07 PM

## 2016-01-05 NOTE — Progress Notes (Signed)
TCTS BRIEF PROGRESS NOTE   Plans for chest tube placement discussed with Dr. Molli KnockYacoub.  Please call if we can be of further assistance.  Purcell Nailslarence H Boyce Keltner, MD 01/05/2016 11:51 AM

## 2016-01-05 NOTE — Procedures (Signed)
Chest Tube Insertion Procedure Note  Indications:  Clinically significant Parapneumonic Effusion  Pre-operative Diagnosis: Effusion  Post-operative Diagnosis: Effusion  Procedure Details  Informed consent was obtained for the procedure, including sedation.  Risks of lung perforation, hemorrhage, arrhythmia, and adverse drug reaction were discussed.   After sterile skin prep, using standard technique, a 28 French tube was placed in the left lateral 5th  rib space.  Findings: 250 ml of serosanguinous fluid obtained  Estimated Blood Loss:  Minimal         Specimens:  None              Complications:  None; patient tolerated the procedure well.         Disposition: ICU - intubated and critically ill.         Condition: stable   Canary BrimBrandi Ollis, NP-C [place] Pulmonary & Critical Care Pgr: [phone] or if no answer [phone] 01/05/2016, 2:14 PM  Alyson ReedyWesam G. Yacoub, M.D. [facility] Pulmonary/Critical Care Medicine. Pager: [phone]. After hours pager: [phone].

## 2016-01-05 NOTE — Progress Notes (Signed)
PULMONARY / CRITICAL CARE MEDICINE   Name: Breanna [name] MRN: [id number] DOB: [birth year]    ADMISSION DATE:  12/18/2015 CONSULTATION DATE:  12/20/2015  CHIEF COMPLAINT:  Treatment for progressing acute respiratory failure, pneumonia and empyema.  HISTORY OF PRESENT ILLNESS:  70 y/o woman with history of GERD, hyperlipidemia, chronic low back pain, hypothyroidism and depression. She was originally seen outpatient on 12/30/15 for severe fatigue, shortness of breath, difficulty taking a deep breath and cough and congestion. She was admitted on 6/16 for [facility] and initially treated with Rocephin and Zithromax, however condition continued to worsen. Antibiotics were then changed to Levaquin, Vancomycin and Meropenum. Condition continue to worsen and she required BiPap and transfer to the ICU. Once in the ICU she was then intubated and transferred [facility] for further treatment and workup.    SUBJECTIVE:    VITAL SIGNS: BP 125/72 mmHg  Pulse 63  Temp(Src) 99.1 F (37.3 C) (Oral)  Resp 20  Ht 5\' 7"  (1.702 m)  Wt 197 lb 8.5 oz (89.6 kg)  BMI 30.93 kg/m2  SpO2 99%  HEMODYNAMICS:    VENTILATOR SETTINGS: Vent Mode:  [-] PRVC FiO2 (%):  [60 %] 60 % Set Rate:  [20 bmp-26 bmp] 20 bmp Vt Set:  [500 mL] 500 mL PEEP:  [5 cmH20-10 cmH20] 10 cmH20 Plateau Pressure:  [25 cmH20-31 cmH20] 28 cmH20  INTAKE / OUTPUT: I/O last 3 completed shifts: In: 2054.7 [I.V.:1248.7; NG/GT:156; IV Piggyback:650] Out: 675 [Urine:675]  PHYSICAL EXAMINATION: General:  Ill looking elderly female Neuro:  Sedated HEENT: Moist mucous membranes Cardiovascular:  No JVD, S1S2, no murmurs Lungs:  Rhonchi throughout, diminished breath sounds in bases L>R, Intubated Abdomen: Obese, BS x 4 quadrants, OGT in place Musculoskeletal:  Sedated, no moving to command Skin: warm and intact  LABS:  BMET  Recent Labs Lab 12/23/2015 1805 01/05/16 0425  NA 141 141  K 2.8* 3.3*  CL 105  108  CO2 28 28  BUN 7 12  CREATININE 0.71 0.62  GLUCOSE 96 121*    Electrolytes  Recent Labs Lab 12/25/2015 1805 01/05/16 0425  CALCIUM 7.9* 8.0*  MG 2.0 2.1  PHOS 3.6 2.9    CBC  Recent Labs Lab 12/28/2015 1805 01/05/16 0425  WBC 7.0 6.8  HGB 8.3* 8.2*  HCT 26.7* 27.2*  PLT 158 155    Coag's  Recent Labs Lab 01/02/2016 1805 01/05/16 0425  APTT 49* 28  INR 1.35 1.32    Sepsis Markers  Recent Labs Lab 01/09/2016 1805 01/05/16 0425  PROCALCITON 1.01 0.74    ABG  Recent Labs Lab 01/02/2016 1542 01/05/16 0335  PHART 7.485* 7.456*  PCO2ART 38.8 40.1  PO2ART 68.0* 81.4    Liver Enzymes  Recent Labs Lab 12/25/2015 1805  AST 16  ALT 12*  ALKPHOS 69  BILITOT 0.5  ALBUMIN 1.9*    Cardiac Enzymes No results for input(s): TROPONINI, PROBNP in the last 168 hours.  Glucose  Recent Labs Lab 12/25/2015 1527 12/21/2015 1941 12/30/2015 2332 01/05/16 0342 01/05/16 0814  GLUCAP 99 81 115* 116* 106*    Imaging Portable Chest Xray  01/05/2016  CLINICAL DATA:  Pneumonia EXAM: PORTABLE CHEST 1 VIEW COMPARISON:  12/15/2015 FINDINGS: Endotracheal tube in good position. Jugular catheter in the SVC unchanged. NG tube in the stomach. Dense consolidation left lower lobe with progression. Progression of right lower lobe infiltrate. IMPRESSION: Progression of dense consolidation in the left lower lobe. Progression of right lower lobe infiltrate.  Probable  pneumonia. Electronically Signed   By: Marlan Palauharles  Clark M.D.   On: 01/05/2016 07:08   Dg Chest Port 1 View  02-05-2016  CLINICAL DATA:  History of ETT; new central line placed per RN EXAM: PORTABLE CHEST 1 VIEW COMPARISON:  None. FINDINGS: Endotracheal tube terminates 4.2 cm above carina.Nasogastric terminates at the body of the stomach. Left internal jugular line tip at mid SVC. Mild cardiomegaly. Moderate left-sided pleural effusion. Mild right hemidiaphragm elevation. No pneumothorax. Patchy interstitial and airspace  disease bilaterally. More dense consolidation in the left lower lobe. IMPRESSION: Appropriate position of endotracheal, nasogastric, and left internal jugular lines, without pneumothorax. Moderate left pleural effusion with adjacent atelectasis or infection. Followup PA and lateral chest X-ray is recommended in 3-4 weeks following trial of antibiotic therapy to ensure resolution and exclude underlying malignancy. Other patchy interstitial and airspace opacities which could represent pulmonary edema and/or infection. Electronically Signed   By: Jeronimo GreavesKyle  Talbot M.D.   On: 007-23-2017 17:25   STUDIES:  CT chest OSH - multifocal pneumonia with possible early empyema on the left side of the chest.  CT abd/Pelvis OSH - no abnormalities V/Q scan OSH - intermediate probability of PE PCXR 6/21- stable ETT, OGT, L IJ, mod Left pleural effusion, bilateral infiltrates  PCXR 6/22 - stable lines, progression of RLL and LLL infiltrates  CULTURES: 6/21 U Legionella >> 6/21 MRSA PCR >> negative 6/21 BC x 2 >> 6/21 U Strep >> negative 6/21 Sputum Cx >>  ANTIBIOTICS: Levaquin 500 mg IV daily 6/19 >> 6/21 Meropenum 1 gram IV q 12 6/19 >> 6/21 Vancomycin 1 gram (per pharmacy dosing) 6/20 >> Imipenem 6/21 >>  SIGNIFICANT EVENTS: 04/25/2016 [facility]  04/25/2016 CVTS consult  LINES/TUBES: ETT 6/21 >> Foley 6/21 >> L TL IJ 6/21 >>   DISCUSSION: 70 yo woman with history of GERD, hyperlipidemia, chronic low back pain, hypothyroidism and depression. Ad[facility] for evaluation and treatment of empyema and pneumonia.   ASSESSMENT / PLAN:  PULMONARY A: Empyema Acute Respiratory Failure Pneumonia P:   PRVC 8 cc/kg  Wean PEEP / FiO2 for sats > 92% Continue abx as above Pending chest tube placement for drainage of empyema Case reviewed with TCTS, not candidate for VATS Place DNase/TPA candidate post chest tube placement CXR/ABG in the AM VAP bundle Wean  vent as tolerated  CARDIOVASCULAR A:  Hyperlipidemia P:  Atorvastatin 40 mg QHS Maintain MAP > 65  RENAL A:   Hypokalemia  Pseudo-hypocalcemia - corrects to 9.6 for albumin  P:   Monitor BMP / UOP Replace electrolytes as indicated Goal for euvolemia NS @ 75 ml/hr  GASTROINTESTINAL A:   GERD P:   Pepcid for GI prophylaxis TF per nutrition   Monitor Triglycerides while on propofol   HEMATOLOGIC A:   Anemia DVT prophylaxis P:  SCD for DVT prophylaxis Holding chemoprophylaxis at this time (CT versus surgery) Holding Iron in the setting of critical illness Further anemia workup when stable  INFECTIOUS A:   Leukocytosis - received 5 days atypical coverage Empyema P:   ABX as above Monitor WBC and fever curve Cultures as above  ENDOCRINE A:   Blood glucose monitoring Hypothyroidism P:   SSI CBG Q 4 hours Continue synthroid 50 mcg daily Assess TSH  NEUROLOGIC A:   Sedated Anxiety / Depression  Dementia P:   RASS goal: -1 Fentanyl/Versed prn Propofol gtt Hold home aricept Continue zoloft  FAMILY  - Updates: No family available am  6/22.  - Inter-disciplinary family meet or Palliative Care meeting due by:  01/11/16  Canary Brim, NP-C [facility] Pgr: [phone] or if no answer [phone] 01/05/2016, 11:09 AM  Attending Note:  70 year old female with LLL PNA and associated empyema in respiratory failure.  Discussed with CVTS, does not need surgery.  On exam, L>R crackles.  Arousable and moving all ext to command.  CXR with ETT ok, effusion noted.  Hold weaning for today.  Place chest tube and place to suction.  Continue abx.  Family updated bedside by me and consent for CT obtained.  The patient is critically ill with multiple organ systems failure and requires high complexity decision making for assessment and support, frequent evaluation and titration of therapies, application of advanced monitoring technologies and extensive  interpretation of multiple databases.   Critical Care Time devoted to patient care services described in this note is  35  Minutes. This time reflects time of care of this signee Dr Koren Bound. This critical care time does not reflect procedure time, or teaching time or supervisory time of PA/NP/Med student/Med Resident etc but could involve care discussion time.  Alyson Reedy, M.D. [facility] Pulmonary/Critical Care Medicine. Pager: [phone]. After hours pager: [phone].

## 2016-01-05 NOTE — Progress Notes (Signed)
Initial Nutrition Assessment  DOCUMENTATION CODES:   Obesity unspecified  INTERVENTION:    Initiate TF via OGT with Vital High Protein at goal rate of 30 ml/h (720 ml per day) and Prostat 30 ml BID to provide 920 kcals, 93 gm protein, 602 ml free water daily.  NUTRITION DIAGNOSIS:   Inadequate oral intake related to inability to eat as evidenced by NPO status.  GOAL:   Provide needs based on ASPEN/SCCM guidelines  MONITOR:   Vent status, Labs, Weight trends, TF tolerance, I & O's  REASON FOR ASSESSMENT:   Consult Enteral/tube feeding initiation and management  ASSESSMENT:   70 yo woman with history of GERD, hyperlipidemia, chronic low back pain, hypothyroidism and depression. Ad[facility] for evaluation and treatment of empyema and pneumonia.   Nutrition-Focused physical exam completed. Findings are no fat depletion, no muscle depletion. Patient is currently intubated on ventilator support. Received MD Consult for TF initiation and management. Temp (24hrs), Avg:99.2 F (37.3 C), Min:97.7 F (36.5 C), Max:100 F (37.8 C)  Propofol: 10.7 ml/hr providing 282 kcals per day  Diet Order:  Diet NPO time specified  Skin:  Reviewed, no issues  Last BM:  6/21  Height:   Ht Readings from Last 1 Encounters:  January 27, 2016 5\' 7"  (1.702 m)    Weight:   Wt Readings from Last 1 Encounters:  01/05/16 197 lb 8.5 oz (89.6 kg)    Ideal Body Weight:  61.4 kg  BMI:  Body mass index is 30.93 kg/(m^2).  Estimated Nutritional Needs:   Kcal:  [phone]  Protein:  90-105 gm  Fluid:  1.5-2 L  EDUCATION NEEDS:   Education needs addressed  Joaquin CourtsKimberly Harris, RD, LDN, CNSC Pager [phone] After Hours Pager [phone]

## 2016-01-06 ENCOUNTER — Inpatient Hospital Stay (HOSPITAL_COMMUNITY): Payer: Medicare Other

## 2016-01-06 DIAGNOSIS — J90 Pleural effusion, not elsewhere classified: Secondary | ICD-10-CM | POA: Insufficient documentation

## 2016-01-06 DIAGNOSIS — E876 Hypokalemia: Secondary | ICD-10-CM

## 2016-01-06 LAB — CBC
HEMATOCRIT: 26.4 % — AB (ref 36.0–46.0)
Hemoglobin: 8 g/dL — ABNORMAL LOW (ref 12.0–15.0)
MCH: 27.4 pg (ref 26.0–34.0)
MCHC: 30.3 g/dL (ref 30.0–36.0)
MCV: 90.4 fL (ref 78.0–100.0)
PLATELETS: 185 10*3/uL (ref 150–400)
RBC: 2.92 MIL/uL — AB (ref 3.87–5.11)
RDW: 15.3 % (ref 11.5–15.5)
WBC: 8.6 10*3/uL (ref 4.0–10.5)

## 2016-01-06 LAB — BLOOD GAS, ARTERIAL
ACID-BASE EXCESS: 4.2 mmol/L — AB (ref 0.0–2.0)
BICARBONATE: 28.1 meq/L — AB (ref 20.0–24.0)
Drawn by: 364961
FIO2: 0.4
LHR: 20 {breaths}/min
O2 Saturation: 94.5 %
PEEP/CPAP: 10 cmH2O
PO2 ART: 75.1 mmHg — AB (ref 80.0–100.0)
Patient temperature: 99
TCO2: 29.3 mmol/L (ref 0–100)
VT: 500 mL
pCO2 arterial: 41.7 mmHg (ref 35.0–45.0)
pH, Arterial: 7.444 (ref 7.350–7.450)

## 2016-01-06 LAB — GLUCOSE, CAPILLARY
GLUCOSE-CAPILLARY: 99 mg/dL (ref 65–99)
GLUCOSE-CAPILLARY: 106 mg/dL — AB (ref 65–99)
Glucose-Capillary: 103 mg/dL — ABNORMAL HIGH (ref 65–99)
Glucose-Capillary: 139 mg/dL — ABNORMAL HIGH (ref 65–99)
Glucose-Capillary: 95 mg/dL (ref 65–99)

## 2016-01-06 LAB — PHOSPHORUS: PHOSPHORUS: 3.7 mg/dL (ref 2.5–4.6)

## 2016-01-06 LAB — LEGIONELLA PNEUMOPHILA SEROGP 1 UR AG: L. PNEUMOPHILA SEROGP 1 UR AG: NEGATIVE

## 2016-01-06 LAB — BASIC METABOLIC PANEL
ANION GAP: 4 — AB (ref 5–15)
BUN: 14 mg/dL (ref 6–20)
CALCIUM: 7.9 mg/dL — AB (ref 8.9–10.3)
CO2: 28 mmol/L (ref 22–32)
Chloride: 111 mmol/L (ref 101–111)
Creatinine, Ser: 0.63 mg/dL (ref 0.44–1.00)
GLUCOSE: 116 mg/dL — AB (ref 65–99)
POTASSIUM: 3.3 mmol/L — AB (ref 3.5–5.1)
Sodium: 143 mmol/L (ref 135–145)

## 2016-01-06 LAB — PROCALCITONIN: PROCALCITONIN: 0.55 ng/mL

## 2016-01-06 LAB — MAGNESIUM: Magnesium: 2.3 mg/dL (ref 1.7–2.4)

## 2016-01-06 MED ORDER — FUROSEMIDE 10 MG/ML IJ SOLN
40.0000 mg | Freq: Four times a day (QID) | INTRAMUSCULAR | Status: AC
  Administered 2016-01-06 (×3): 40 mg via INTRAVENOUS
  Filled 2016-01-06 (×3): qty 4

## 2016-01-06 MED ORDER — SODIUM CHLORIDE 0.9 % IV SOLN
25.0000 ug/h | INTRAVENOUS | Status: DC
  Administered 2016-01-06 – 2016-01-08 (×3): 100 ug/h via INTRAVENOUS
  Filled 2016-01-06 (×4): qty 50

## 2016-01-06 MED ORDER — POTASSIUM CHLORIDE 20 MEQ/15ML (10%) PO SOLN
40.0000 meq | Freq: Three times a day (TID) | ORAL | Status: AC
  Administered 2016-01-06 (×2): 40 meq
  Filled 2016-01-06 (×2): qty 30

## 2016-01-06 MED ORDER — ANTISEPTIC ORAL RINSE SOLUTION (CORINZ)
7.0000 mL | OROMUCOSAL | Status: DC
  Administered 2016-01-06 – 2016-01-10 (×41): 7 mL via OROMUCOSAL

## 2016-01-06 MED ORDER — POTASSIUM CHLORIDE 10 MEQ/50ML IV SOLN
10.0000 meq | INTRAVENOUS | Status: AC
  Administered 2016-01-06 (×4): 10 meq via INTRAVENOUS
  Filled 2016-01-06 (×4): qty 50

## 2016-01-06 MED ORDER — HEPARIN SODIUM (PORCINE) 5000 UNIT/ML IJ SOLN
5000.0000 [IU] | Freq: Three times a day (TID) | INTRAMUSCULAR | Status: DC
  Administered 2016-01-06 – 2016-01-10 (×11): 5000 [IU] via SUBCUTANEOUS
  Filled 2016-01-06 (×12): qty 1

## 2016-01-06 MED ORDER — CHLORHEXIDINE GLUCONATE 0.12% ORAL RINSE (MEDLINE KIT)
15.0000 mL | Freq: Two times a day (BID) | OROMUCOSAL | Status: DC
  Administered 2016-01-06 – 2016-01-10 (×9): 15 mL via OROMUCOSAL

## 2016-01-06 MED ORDER — MIDAZOLAM HCL 2 MG/2ML IJ SOLN
1.0000 mg | INTRAMUSCULAR | Status: DC | PRN
  Administered 2016-01-10: 2 mg via INTRAVENOUS
  Filled 2016-01-06 (×3): qty 2

## 2016-01-06 MED ORDER — LIDOCAINE HCL (PF) 1 % IJ SOLN
INTRAMUSCULAR | Status: AC
  Administered 2016-01-06: 20 mL
  Filled 2016-01-06: qty 5

## 2016-01-06 MED ORDER — LIDOCAINE HCL (PF) 1 % IJ SOLN
INTRAMUSCULAR | Status: AC
  Administered 2016-01-06: 5 mL
  Filled 2016-01-06: qty 30

## 2016-01-06 NOTE — Progress Notes (Signed)
PULMONARY / CRITICAL CARE MEDICINE   Name: Breanna [name] MRN: [id number] DOB: 1945-10-30    ADMISSION DATE:  01/12/2016 CONSULTATION DATE:  01/01/2016  CHIEF COMPLAINT:  Treatment for progressing acute respiratory failure, pneumonia and empyema.  HISTORY OF PRESENT ILLNESS:  70 y/o woman with history of GERD, hyperlipidemia, chronic low back pain, hypothyroidism and depression. She was originally seen outpatient on 12/30/15 for severe fatigue, shortness of breath, difficulty taking a deep breath and cough and congestion. She was admitted on 6/16 for inpatient treatment at [facility] and initially treated with Rocephin and Zithromax, however condition continued to worsen. Antibiotics were then changed to Levaquin, Vancomycin and Meropenum. Condition continue to worsen and she required BiPap and transfer to the ICU. Once in the ICU she was then intubated and transferred to [facility] for further treatment and workup.   SUBJECTIVE:  No events overnight, moving all ext to command.  VITAL SIGNS: BP 126/66 mmHg  Pulse 62  Temp(Src) 98.5 F (36.9 C) (Oral)  Resp 23  Ht  (1.702 m)  Wt 94.2 kg (207 lb 10.8 oz)  BMI 32.52 kg/m2  SpO2 97%  HEMODYNAMICS:    VENTILATOR SETTINGS: Vent Mode:  [-] PRVC FiO2 (%):  [40 %-60 %] 40 % Set Rate:  [20 bmp] 20 bmp Vt Set:  [500 mL] 500 mL PEEP:  [5 cmH20-10 cmH20] 5 cmH20 Plateau Pressure:  [27 cmH20-32 cmH20] 27 cmH20  INTAKE / OUTPUT: I/O last 3 completed shifts: In: 5946.7 [I.V.:3070.7; NG/GT:1376; IV Piggyback:1500] Out: 2235 [Urine:2135; Chest Tube:100]  PHYSICAL EXAMINATION: General:  Ill looking elderly female Neuro:  Awake and interactive, following commands HEENT: Moist mucous membranes Cardiovascular:  No JVD, S1S2, no murmurs Lungs:  Rhonchi throughout, diminished breath sounds in bases L>R, Intubated Abdomen: Obese, BS x 4 quadrants, OGT in place Musculoskeletal:  Sedated, no moving to command Skin: warm and  intact  LABS:  BMET  Recent Labs Lab 12/30/2015 1805 01/05/16 0425 01/06/16 0400  NA 141 141 143  K 2.8* 3.3* 3.3*  CL 105 108 111  CO2 BUN CREATININE 0.71 0.62 0.63  GLUCOSE 96 121* 116*   Electrolytes  Recent Labs Lab 01/01/2016 1805 01/05/16 0425 01/05/16 1630 01/06/16 0400  CALCIUM 7.9* 8.0*  --  7.9*  MG 2.0 2.1 2.1 2.3  PHOS 3.6 2.9 3.2 3.7   CBC  Recent Labs Lab 01/11/2016 1805 01/05/16 0425 01/06/16 0400  WBC 7.0 6.8 8.6  HGB 8.3* 8.2* 8.0*  HCT 26.7* 27.2* 26.4*  PLT 158 155 185   Coag's  Recent Labs Lab 12/15/2015 1805 01/05/16 0425  APTT 49* 28  INR 1.35 1.32   Sepsis Markers  Recent Labs Lab 12/19/2015 1805 01/05/16 0425 01/06/16 0400  PROCALCITON 1.01 0.74 0.55   ABG  Recent Labs Lab 12/26/2015 1542 01/05/16 0335 01/06/16 0333  PHART 7.485* 7.456* 7.444  PCO2ART 38.8 40.1 41.7  PO2ART 68.0* 81.4 75.1*   Liver Enzymes  Recent Labs Lab 01/12/2016 1805  AST 16  ALT 12*  ALKPHOS 69  BILITOT 0.5  ALBUMIN 1.9*   Cardiac Enzymes No results for input(s): TROPONINI, PROBNP in the last 168 hours.  Glucose  Recent Labs Lab 01/05/16 1150 01/05/16 1551 01/05/16 1956 01/05/16 2341 01/06/16 0345 01/06/16 0809  GLUCAP 93 102* 107* 103* 139* 99   Imaging Dg Chest Portable 2 Views (neonate)  01/05/2016  CLINICAL DATA:  Patient status post thoracostomy tube placement. EXAM: CHEST  2 VIEW PORTABLE  COMPARISON:  Chest radiograph 01/05/2016. FINDINGS: ET tube terminates in the mid trachea. Left IJ central venous catheter is present with tip projecting over the superior vena cava. Enteric tube courses inferior to the diaphragm. Monitoring leads overlie the patient. Stable enlarged cardiac and mediastinal contours. Pulmonary vascular redistribution. Retrocardiac consolidation. Small left pleural effusion. Interval insertion left-sided chest tube. The chest tube appears kinked within the left hemi thorax. IMPRESSION: Interval  insertion of left-sided chest tube which appears kinked. Persistent left lower lung consolidation and probable associated effusion. Electronically Signed   By: Annia Beltrew  Davis M.D.   On: 01/05/2016 15:23   Dg Chest Port 1 View  01/06/2016  CLINICAL DATA:  Acute respiratory failure, chest tube treatment of pleural effusion on the left. EXAM: PORTABLE CHEST 1 VIEW COMPARISON:  PA and lateral chest x-ray of January 05, 2016 FINDINGS: The left lung is hypoinflated. There is persistent increased density at the lung base. The chest tube appears to be in stable position and the kinking persists. There is no pneumothorax. A moderate amount of pleural fluid likely remains on the left. On the right the interstitial markings remain increased. Near confluent density in the right lower lung is present. The heart is normal in size. The pulmonary vascularity is prominent centrally but stable. The endotracheal tube tip lies 4 cm above the carina. The esophagogastric tube tip projects below the inferior margin of the image. The left internal jugular venous catheter tip projects over the proximal SVC. IMPRESSION: Fairly stable appearance of the chest since yesterday's study. Persistent opacity at the left lung base compatible with known pleural effusion. The chest tube remains kinked but is unchanged in position. There is no pneumothorax. Electronically Signed   By: David  Swazil[place]Jordan M.D.   On: 01/06/2016 07:41   STUDIES:  CT chest OSH - multifocal pneumonia with possible early empyema on the left side of the chest.  CT abd/Pelvis OSH - no abnormalities V/Q scan OSH - intermediate probability of PE PCXR 6/21- stable ETT, OGT, L IJ, mod Left pleural effusion, bilateral infiltrates  PCXR 6/22 - stable lines, progression of RLL and LLL infiltrates  CULTURES: 6/21 U Legionella >> 6/21 MRSA PCR >> negative 6/21 BC x 2 >> 6/21 U Strep >> negative 6/21 Sputum Cx >>  ANTIBIOTICS: Levaquin 500 mg IV daily 6/19 >> 6/21 Meropenum  1 gram IV q 12 6/19 >> 6/21 Vancomycin 1 gram (per pharmacy dosing) 6/20 >> Imipenem 6/21 >>  SIGNIFICANT EVENTS: 04/17/2016 [facility]  04/17/2016 CVTS consult  LINES/TUBES: ETT 6/21 >> Foley 6/21 >> L TL IJ 6/21 >>  DISCUSSION: 70 yo woman with history of GERD, hyperlipidemia, chronic low back pain, hypothyroidism and depression. Ad[facility] for evaluation and treatment of empyema and pneumonia.   ASSESSMENT / PLAN:  PULMONARY A: Empyema Acute Respiratory Failure Pneumonia P:   PRVC 8 cc/kg  Wean PEEP / FiO2 for sats > 92% Continue abx as above Place a second chest tube today. Case reviewed with TCTS, not candidate for VATS, proceed with chest tube. Placed DNase/TPA candidate post chest tube placement CXR/ABG in the AM VAP bundle PS as tolerated  CARDIOVASCULAR A:  Hyperlipidemia P:  Atorvastatin 40 mg QHS Maintain MAP > 65   RENAL A:   Hypokalemia  Pseudo-hypocalcemia - corrects to 9.6 for albumin  P:   Monitor BMP / UOP Replace electrolytes as indicated Goal for euvolemia KVO IVF. Lasix 40 mg IV q6 x3 doses.  GASTROINTESTINAL  A:   GERD P:   Pepcid for GI prophylaxis TF per nutrition   Monitor Triglycerides while on propofol   HEMATOLOGIC A:   Anemia DVT prophylaxis P:  SCD for DVT prophylaxis Holding chemoprophylaxis at this time (CT versus surgery) Holding Iron in the setting of critical illness Further anemia workup when stable  INFECTIOUS A:   Leukocytosis - received 5 days atypical coverage Empyema P:   ABX as above Monitor WBC and fever curve Cultures as above  ENDOCRINE A:   Blood glucose monitoring Hypothyroidism P:   SSI CBG Q 4 hours Continue synthroid 50 mcg daily  NEUROLOGIC A:   Sedated Anxiety / Depression  Dementia P:   RASS goal: -1 Fentanyl drip Versed PRN Propofol gtt, will d/c today. Hold home aricept Continue zoloft  FAMILY  - Updates: Family updated  bedside.  Will place a second chest tube, diurese and begin PS trials.  - Inter-disciplinary family meet or Palliative Care meeting due by:  01/11/16  The patient is critically ill with multiple organ systems failure and requires high complexity decision making for assessment and support, frequent evaluation and titration of therapies, application of advanced monitoring technologies and extensive interpretation of multiple databases.   Critical Care Time devoted to patient care services described in this note is  35  Minutes. This time reflects time of care of this signee Dr Koren BoundWesam Margherita Collyer. This critical care time does not reflect procedure time, or teaching time or supervisory time of PA/NP/Med student/Med Resident etc but could involve care discussion time.  Alyson ReedyWesam G. Darlena Koval, M.D. [facility] Pulmonary/Critical Care Medicine. Pager: [phone]. After hours pager: [phone].

## 2016-01-06 NOTE — Care Management Note (Signed)
Case Management Note  Patient Details  Name: Breanna [name] MRN: [id number] Date of Birth: [birth year]  Subjective/Objective:  Pt admitted with acute resp failure - on ventilator                  Action/Plan:  PTA is independent from home with husband .  Both husband and son at bedside.   CM will continue to monitor for diposition needs   Expected Discharge Date:                  Expected Discharge Plan:  Home/Self Care  In-House Referral:     Discharge planning Services  CM Consult  Post Acute Care Choice:    Choice offered to:     DME Arranged:    DME Agency:     HH Arranged:    HH Agency:     Status of Service:  In process, will continue to follow  If discussed at Long Length of Stay Meetings, dates discussed:    Additional Comments:  Cherylann ParrClaxton, Lavine Hargrove S, RN 01/06/2016, 11:58 AM

## 2016-01-06 NOTE — Care Management Important Message (Signed)
Important Message  Patient Details  Name: Breanna KosMary Cappelletti MRN: [id number] Date of Birth: Feb 15, 1946   Medicare Important Message Given:  Yes    Kyla BalzarineShealy, Charna Neeb Abena 01/06/2016, 10:25 AM

## 2016-01-06 NOTE — Progress Notes (Signed)
eLink Physician-Brief Progress Note Patient Name: Breanna KosMary Sylvestre DOB: 1945-08-09 MRN: [id number]   Date of Service  01/06/2016  HPI/Events of Note  Hypokalemia 3.3. Serum creatinine 0.63. Central IV access.   eICU Interventions  KCl 10mEq IV 4 runs      Intervention Category Intermediate Interventions: Electrolyte abnormality - evaluation and management  Lawanda CousinsJennings Corbyn Steedman 01/06/2016, 5:28 AM

## 2016-01-06 NOTE — Procedures (Signed)
Chest Tube Insertion Procedure Note  Indications:  Clinically significant Empyema  Pre-operative Diagnosis: Empyema  Post-operative Diagnosis: Empyema  Procedure Details  Informed consent was obtained for the procedure, including sedation.  Risks of lung perforation, hemorrhage, arrhythmia, and adverse drug reaction were discussed.   After sterile skin prep, using standard technique, a 28 French tube was placed in the left lateral 5 rib space.  Findings: None  Estimated Blood Loss:  less than 50 mL         Specimens:  None              Complications:  None; patient tolerated the procedure well.         Disposition: ICU - intubated and critically ill.         Condition: stable  Attending Attestation: I was present and scrubbed for the entire procedure.  Alyson ReedyWesam G. Josiah Wojtaszek, M.D. [facility] Pulmonary/Critical Care Medicine. Pager: [phone]. After hours pager: [phone].

## 2016-01-07 ENCOUNTER — Inpatient Hospital Stay (HOSPITAL_COMMUNITY): Payer: Medicare Other

## 2016-01-07 LAB — BASIC METABOLIC PANEL
ANION GAP: 7 (ref 5–15)
BUN: 22 mg/dL — ABNORMAL HIGH (ref 6–20)
CALCIUM: 8 mg/dL — AB (ref 8.9–10.3)
CO2: 30 mmol/L (ref 22–32)
Chloride: 107 mmol/L (ref 101–111)
Creatinine, Ser: 0.67 mg/dL (ref 0.44–1.00)
GLUCOSE: 108 mg/dL — AB (ref 65–99)
POTASSIUM: 3.8 mmol/L (ref 3.5–5.1)
Sodium: 144 mmol/L (ref 135–145)

## 2016-01-07 LAB — GLUCOSE, CAPILLARY
GLUCOSE-CAPILLARY: 106 mg/dL — AB (ref 65–99)
GLUCOSE-CAPILLARY: 123 mg/dL — AB (ref 65–99)
GLUCOSE-CAPILLARY: 103 mg/dL — AB (ref 65–99)
GLUCOSE-CAPILLARY: 121 mg/dL — AB (ref 65–99)
GLUCOSE-CAPILLARY: 113 mg/dL — AB (ref 65–99)
GLUCOSE-CAPILLARY: 129 mg/dL — AB (ref 65–99)
Glucose-Capillary: 120 mg/dL — ABNORMAL HIGH (ref 65–99)

## 2016-01-07 LAB — TRIGLYCERIDES: TRIGLYCERIDES: 124 mg/dL (ref <150)

## 2016-01-07 LAB — BLOOD GAS, ARTERIAL
ACID-BASE EXCESS: 5.3 mmol/L — AB (ref 0.0–2.0)
BICARBONATE: 30.3 meq/L — AB (ref 20.0–24.0)
DRAWN BY: 40415
FIO2: 1
O2 SAT: 90.6 %
PEEP: 5 cmH2O
Patient temperature: 98.5
RATE: 15 resp/min
TCO2: 31.9 mmol/L (ref 0–100)
VT: 370 mL
pCO2 arterial: 52.3 mmHg — ABNORMAL HIGH (ref 35.0–45.0)
pH, Arterial: 7.38 (ref 7.350–7.450)
pO2, Arterial: 65.8 mmHg — ABNORMAL LOW (ref 80.0–100.0)

## 2016-01-07 LAB — CULTURE, RESPIRATORY: CULTURE: NO GROWTH

## 2016-01-07 LAB — CBC
HEMATOCRIT: 25.4 % — AB (ref 36.0–46.0)
Hemoglobin: 7.6 g/dL — ABNORMAL LOW (ref 12.0–15.0)
MCH: 27.6 pg (ref 26.0–34.0)
MCHC: 29.9 g/dL — ABNORMAL LOW (ref 30.0–36.0)
MCV: 92.4 fL (ref 78.0–100.0)
Platelets: 203 10*3/uL (ref 150–400)
RBC: 2.75 MIL/uL — AB (ref 3.87–5.11)
RDW: 15.4 % (ref 11.5–15.5)
WBC: 11.8 10*3/uL — AB (ref 4.0–10.5)

## 2016-01-07 LAB — MAGNESIUM: Magnesium: 2.2 mg/dL (ref 1.7–2.4)

## 2016-01-07 LAB — VANCOMYCIN, TROUGH: VANCOMYCIN TR: 18 ug/mL (ref 10.0–20.0)

## 2016-01-07 LAB — CULTURE, RESPIRATORY W GRAM STAIN

## 2016-01-07 LAB — PHOSPHORUS: PHOSPHORUS: 5.1 mg/dL — AB (ref 2.5–4.6)

## 2016-01-07 NOTE — Progress Notes (Signed)
Attempted to wean FiO2 to 80%. SpO2 dropped to 91%. Goal is to keep SpO2>92. Pt back on 100%. RT will continue to monitor

## 2016-01-07 NOTE — Progress Notes (Signed)
PULMONARY / CRITICAL CARE MEDICINE   Name: Breanna KosMary [name] MRN: [id number] DOB: [birth year]    ADMISSION DATE:  12/28/2015 CONSULTATION DATE:  01/03/2016  CHIEF COMPLAINT:  Treatment for progressing acute respiratory failure, pneumonia and empyema.  HISTORY OF PRESENT ILLNESS:  70 y/o woman with history of GERD, hyperlipidemia, chronic low back pain, hypothyroidism and depression. She was originally seen outpatient on 12/30/15 for severe fatigue, shortness of breath, difficulty taking a deep breath and cough and congestion. She was admitted on 6/16 for inpatient treatment at [facility] and initially treated with Rocephin and Zithromax, however condition continued to worsen. Antibiotics were then changed to Levaquin, Vancomycin and Meropenum. Condition continue to worsen and she required BiPap and transfer to the ICU. Once in the ICU she was then intubated and transferred to [facility] for further treatment and workup.   SUBJECTIVE:  Sedate, no distress on vent.  CT in place.  Afebrile.     VITAL SIGNS: BP 106/49 mmHg  Pulse 70  Temp(Src) 98.9 F (37.2 C) (Oral)  Resp 28  Ht 5\' 7"  (1.702 m)  Wt 199 lb 1.2 oz (90.3 kg)  BMI 31.17 kg/m2  SpO2 90%  HEMODYNAMICS:    VENTILATOR SETTINGS: Vent Mode:  [-] PRVC FiO2 (%):  [60 %-100 %] 100 % Set Rate:  [15 bmp] 15 bmp Vt Set:  [370 mL] 370 mL PEEP:  [5 cmH20-8 cmH20] 8 cmH20 Plateau Pressure:  [17 cmH20-24 cmH20] 22 cmH20  INTAKE / OUTPUT: I/O last 3 completed shifts: In: 4474.5 [I.V.:1664.5; NG/GT:1410; IV Piggyback:1400] Out: 5888 [Urine:5825; Chest Tube:63]  PHYSICAL EXAMINATION: General:  Ill looking elderly female on vent Neuro: sedate, no distress HEENT: Moist mucous membranes, ETT Cardiovascular:  No JVD, S1S2, no murmurs Lungs:  Rhonchi throughout, diminished breath sounds in bases L>R, Intubated Abdomen: Obese, BS x 4 quadrants, OGT in place Musculoskeletal:  no acute deformities Skin: warm and  intact  LABS:  BMET  Recent Labs Lab 01/05/16 0425 01/06/16 0400 01/07/16 0539  NA 141 143 144  K 3.3* 3.3* 3.8  CL 108 111 107  CO2 28 28 30   BUN 12 14 22*  CREATININE 0.62 0.63 0.67  GLUCOSE 121* 116* 108*   Electrolytes  Recent Labs Lab 01/05/16 0425 01/05/16 1630 01/06/16 0400 01/07/16 0539  CALCIUM 8.0*  --  7.9* 8.0*  MG 2.1 2.1 2.3 2.2  PHOS 2.9 3.2 3.7 5.1*   CBC  Recent Labs Lab 01/05/16 0425 01/06/16 0400 01/07/16 0539  WBC 6.8 8.6 11.8*  HGB 8.2* 8.0* 7.6*  HCT 27.2* 26.4* 25.4*  PLT 155 185 203   Coag's  Recent Labs Lab 12/25/2015 1805 01/05/16 0425  APTT 49* 28  INR 1.35 1.32   Sepsis Markers  Recent Labs Lab 01/03/2016 1805 01/05/16 0425 01/06/16 0400  PROCALCITON 1.01 0.74 0.55   ABG  Recent Labs Lab 01/05/16 0335 01/06/16 0333 01/07/16 0414  PHART 7.456* 7.444 7.380  PCO2ART 40.1 41.7 52.3*  PO2ART 81.4 75.1* 65.8*   Liver Enzymes  Recent Labs Lab 01/08/2016 1805  AST 16  ALT 12*  ALKPHOS 69  BILITOT 0.5  ALBUMIN 1.9*   Cardiac Enzymes No results for input(s): TROPONINI, PROBNP in the last 168 hours.  Glucose  Recent Labs Lab 01/06/16 1147 01/06/16 1553 01/06/16 1951 01/07/16 0005 01/07/16 0324 01/07/16 0805  GLUCAP 95 106* 106* 120* 123* 103*   Imaging Dg Chest Port 1 View  01/06/2016  CLINICAL DATA:  Empyema.  Chest tube placement. EXAM: PORTABLE CHEST 1  VIEW COMPARISON:  Single-view of the chest earlier today. FINDINGS: Chest tube on the left which was coronal the lower left chest is no longer seen. The chest tube is now identified with the tip projecting at the level of the inferior aspect of the aortic arch. Left pleural effusion and basilar airspace disease appear unchanged. Patchy airspace disease throughout the right chest appears worsened. Lung volumes are somewhat lower than on the study earlier today. There is a small right pleural effusion. Endotracheal tube, NG tube and left IJ catheter are  again seen. IMPRESSION: Left chest tube placement. Negative for pneumothorax. No change in left effusion and basilar airspace disease. Increased airspace opacity throughout the right chest may be due in part to atelectasis given lower lung volumes on the current study. Electronically Signed   By: Drusilla Kannerhomas  Dalessio M.D.   On: 01/06/2016 14:12    STUDIES:  CT chest OSH - multifocal pneumonia with possible early empyema on the left side of the chest.  CT abd/Pelvis OSH - no abnormalities V/Q scan OSH - intermediate probability of PE PCXR 6/21- stable ETT, OGT, L IJ, mod Left pleural effusion, bilateral infiltrates  PCXR 6/22 - stable lines, progression of RLL and LLL infiltrates  CULTURES: 6/21 U Legionella >> 6/21 MRSA PCR >> negative 6/21 BC x 2 >> 6/21 U Strep >> negative 6/21 Sputum Cx >>  ANTIBIOTICS: Levaquin 500 mg IV daily 6/19 >> 6/21 Meropenum 1 gram IV q 12 6/19 >> 6/21 Vancomycin 1 gram (per pharmacy dosing) 6/20 >> Imipenem 6/21 >>  SIGNIFICANT EVENTS: 6/21 [facility]  6/21 CVTS consult 6/22  CT placed on L 6/23  CT replaced on L  LINES/TUBES: ETT 6/21 >> Foley 6/21 >> L TL IJ 6/21 >> L CT 6/22 >>   DISCUSSION: 70 yo woman with history of GERD, hyperlipidemia, chronic low back pain, hypothyroidism and depression. [facility] for evaluation and treatment of empyema and pneumonia.   ASSESSMENT / PLAN:  PULMONARY A: Empyema Acute Respiratory Failure Pneumonia P:   PRVC 8 cc/kg  Wean PEEP / FiO2 for sats > 92% Continue abx as above Case reviewed with TCTS, not candidate for VATS  Consider DNase/TPA depending on output Trend CXR PS wean as tolerated  CARDIOVASCULAR A:  Hyperlipidemia P:  Atorvastatin 40 mg QHS Maintain MAP > 65  DNR  RENAL A:   Hypokalemia  Pseudo-hypocalcemia - corrects to 9.6 for albumin  P:   Monitor BMP / UOP Replace electrolytes as indicated Goal for  euvolemia  GASTROINTESTINAL A:   GERD P:   Pepcid for GI prophylaxis TF per nutrition    HEMATOLOGIC A:   Anemia DVT prophylaxis P:  SCD's / Heparin for DVT prophylaxis Trend CBC Further anemia workup when stable  INFECTIOUS A:   Leukocytosis - received 5 days atypical coverage Empyema P:   ABX as above Monitor WBC and fever curve Cultures as above  ENDOCRINE A:   Blood glucose monitoring Hypothyroidism P:   SSI CBG Q 4 hours Continue synthroid 50 mcg daily  NEUROLOGIC A:   Sedated Anxiety / Depression  Dementia P:   RASS goal: -1 Fentanyl drip Versed PRN Hold home aricept Continue zoloft  FAMILY  - Updates:  No family at bedside 6/24  - Inter-disciplinary family meet or Palliative Care meeting due by:  01/11/16   Canary BrimBrandi Ollis, NP-C [facility] Pgr: [phone] or if no answer [phone] 01/07/2016, 9:07 AM  Attending Note:  I have examined patient, reviewed labs, studies and notes. I have discussed the case with B Ollis, and I agree with the data and plans as amended above. 70 yo woman, admitted for bilateral CAP on 6/16 to OSH. Developed L empyema that required tube drainage. 2nd chest tube placed 6/23. CXR on 6/24 with persistent infiltrates, tube in good position. On my eval today her fever has resolved, hemodynamically stable. She has B coarse BS and rhonchi. No air leak on chest tube. Will plan to continue current abx, push vent weaning. Consider DNase/t-PA to chest tube depending on output. Consider repeat imaging, CT scan if concern for complicated pleural space.  Independent critical care time is 35 minutes.   Levy Pupa, MD, PhD 01/07/2016, 11:34 AM [facility] [phone] or if no answer [phone]

## 2016-01-07 NOTE — Progress Notes (Addendum)
Pharmacy Antibiotic Note  Breanna [name] is a 70 y.o. female  transferred on 01/09/2016 with empyema and pneumonia.    Minimal clinical improvement on ceftriaxone and [facility]ithromycin [facility], transitioned to levofloxacin, meropenem, and vancomycin prior to [facility]. Continuing vancomycin and Primaxin during this admission. Chest tube was placed yesterday and is draining appropriately. Vancomycin trough is therapeutic.  WBC wnl, afebrile.   6/21 urine: ngtd 6/21 blood cx: ngtd 6/21 TA: neg 6/21 MRSA pcr: neg  6/21 vanc > 6/21 Primaxin >  *Pt received meropenem/levaquin from 6/19-6/21, vancomycin on 6/20 all at outside hospital prior to transfer *I spoke with [facility] today, there were no cultures drawn there  Plan: Continue vancomycin 1000 mg q12h Continue Primaxin 500 mg q6h Monitor renal function, s/sx clinical improvement F/u cx results, VT as indicated   Height: 5\' 7"  (170.2 cm) Weight: 199 lb 1.2 oz (90.3 kg) IBW/kg (Calculated) : 61.6  Temp (24hrs), Avg:98.4 F (36.9 C), Min:97.9 F (36.6 C), Max:98.9 F (37.2 C)   Recent Labs Lab 12/31/2015 1805 01/05/16 0425 01/06/16 0400 01/07/16 0539 01/07/16 1004  WBC 7.0 6.8 8.6 11.8*  --   CREATININE 0.71 0.62 0.63 0.67  --   VANCOTROUGH  --   --   --   --  18    Estimated Creatinine Clearance: 75.5 mL/min (by C-G formula based on Cr of 0.67).      Breanna [name], PharmD, BCPS Clinical Pharmacist 01/07/2016 11:34 AM

## 2016-01-08 ENCOUNTER — Inpatient Hospital Stay (HOSPITAL_COMMUNITY): Payer: Medicare Other

## 2016-01-08 LAB — BASIC METABOLIC PANEL
ANION GAP: 6 (ref 5–15)
BUN: 23 mg/dL — AB (ref 6–20)
CO2: 29 mmol/L (ref 22–32)
Calcium: 8 mg/dL — ABNORMAL LOW (ref 8.9–10.3)
Chloride: 110 mmol/L (ref 101–111)
Creatinine, Ser: 0.64 mg/dL (ref 0.44–1.00)
GFR calc Af Amer: >60 mL/min (ref >60)
GFR calc non Af Amer: >60 mL/min (ref >60)
GLUCOSE: 124 mg/dL — AB (ref 65–99)
POTASSIUM: 4 mmol/L (ref 3.5–5.1)
Sodium: 145 mmol/L (ref 135–145)

## 2016-01-08 LAB — GLUCOSE, CAPILLARY
GLUCOSE-CAPILLARY: 123 mg/dL — AB (ref 65–99)
GLUCOSE-CAPILLARY: 132 mg/dL — AB (ref 65–99)
GLUCOSE-CAPILLARY: 123 mg/dL — AB (ref 65–99)
Glucose-Capillary: 108 mg/dL — ABNORMAL HIGH (ref 65–99)
Glucose-Capillary: 128 mg/dL — ABNORMAL HIGH (ref 65–99)
Glucose-Capillary: 131 mg/dL — ABNORMAL HIGH (ref 65–99)

## 2016-01-08 LAB — CBC
HCT: 25.8 % — ABNORMAL LOW (ref 36.0–46.0)
HEMOGLOBIN: 7.5 g/dL — AB (ref 12.0–15.0)
MCH: 27.3 pg (ref 26.0–34.0)
MCHC: 29.1 g/dL — AB (ref 30.0–36.0)
MCV: 93.8 fL (ref 78.0–100.0)
Platelets: 228 10*3/uL (ref 150–400)
RBC: 2.75 MIL/uL — ABNORMAL LOW (ref 3.87–5.11)
RDW: 15.2 % (ref 11.5–15.5)
WBC: 15.1 10*3/uL — ABNORMAL HIGH (ref 4.0–10.5)

## 2016-01-08 NOTE — Progress Notes (Signed)
Transported pt room 2M08 to CT1. Pt remained stable with no complications. Pt now back in room on 80% O2, PEEP 12. Pt vitals are stable. RT will continue to monitor.

## 2016-01-08 NOTE — Progress Notes (Signed)
PULMONARY / CRITICAL CARE MEDICINE   Name: Breanna [name] MRN: [id number] DOB: 10-30-1945    ADMISSION DATE:  01/05/2016 CONSULTATION DATE:  12/19/2015  CHIEF COMPLAINT:  Treatment for progressing acute respiratory failure, pneumonia and empyema.  HISTORY OF PRESENT ILLNESS:  70 y/o woman with history of GERD, hyperlipidemia, chronic low back pain, hypothyroidism and depression. She was originally seen outpatient on 12/30/15 for severe fatigue, shortness of breath, difficulty taking a deep breath and cough and congestion. She was admitted on 6/16 for inpatient treatment at [facility] and initially treated with Rocephin and Zithromax, however condition continued to worsen. Antibiotics were then changed to Levaquin, Vancomycin and Meropenum. Condition continue to worsen and she required BiPap and transfer to the ICU. Once in the ICU she was then intubated and transferred to [facility] for further treatment and workup.   SUBJECTIVE:  More alert on vent.  Remains on 8 peep / 100 % - periods of desaturation when weaned.    VITAL SIGNS: BP 106/46 mmHg  Pulse 73  Temp(Src) 98.8 F (37.1 C) (Oral)  Resp 26  Ht  (1.702 m)  Wt 204 lb 12.9 oz (92.9 kg)  BMI 32.07 kg/m2  SpO2 95%  HEMODYNAMICS:    VENTILATOR SETTINGS: Vent Mode:  [-] PRVC FiO2 (%):  [80 %-100 %] 90 % Set Rate:  [15 bmp] 15 bmp Vt Set:  [370 mL] 370 mL PEEP:  [8 cmH20] 8 cmH20 Plateau Pressure:  [18 cmH20-31 cmH20] 31 cmH20  INTAKE / OUTPUT: I/O last 3 completed shifts: In: 3649.8 [I.V.:949.8; NG/GT:1350; IV Piggyback:1350] Out: 3313 [Urine:3260; Chest Tube:53]  PHYSICAL EXAMINATION: General:  Ill looking elderly female on vent Neuro: sedate, no distress.  Opens eyes to voice.   HEENT: Moist mucous membranes, ETT Cardiovascular:  No JVD, S1S2, no murmurs Lungs:  Bronchial breath sounds on L, CT in place, coarse on R Abdomen: Obese, BS x 4 quadrants, OGT in place Musculoskeletal:  no acute deformities Skin:  warm and intact  LABS:  BMET  Recent Labs Lab 01/06/16 0400 01/07/16 0539 01/08/16 0245  NA 143 144 145  K 3.3* 3.8 4.0  CL 111 107 110  CO2 BUN 14 22* 23*  CREATININE 0.63 0.67 0.64  GLUCOSE 116* 108* 124*   Electrolytes  Recent Labs Lab 01/05/16 1630 01/06/16 0400 01/07/16 0539 01/08/16 0245  CALCIUM  --  7.9* 8.0* 8.0*  MG 2.1 2.3 2.2  --   PHOS 3.2 3.7 5.1*  --    CBC  Recent Labs Lab 01/06/16 0400 01/07/16 0539 01/08/16 0245  WBC 8.6 11.8* 15.1*  HGB 8.0* 7.6* 7.5*  HCT 26.4* 25.4* 25.8*  PLT 185 203 228   Coag's  Recent Labs Lab 10-Jan-2016 1805 01/05/16 0425  APTT 49* 28  INR 1.35 1.32   Sepsis Markers  Recent Labs Lab 12/23/2015 1805 01/05/16 0425 01/06/16 0400  PROCALCITON 1.01 0.74 0.55   ABG  Recent Labs Lab 01/05/16 0335 01/06/16 0333 01/07/16 0414  PHART 7.456* 7.444 7.380  PCO2ART 40.1 41.7 52.3*  PO2ART 81.4 75.1* 65.8*   Liver Enzymes  Recent Labs Lab 01/07/2016 1805  AST 16  ALT 12*  ALKPHOS 69  BILITOT 0.5  ALBUMIN 1.9*   Cardiac Enzymes No results for input(s): TROPONINI, PROBNP in the last 168 hours.  Glucose  Recent Labs Lab 01/07/16 1203 01/07/16 1608 01/07/16 1945 01/08/16 0007 01/08/16 0403 01/08/16 0754  GLUCAP 121* 113* 129* 128* 108* 123*   Imaging Dg Chest  Port 1 View  01/08/2016  CLINICAL DATA:  Acute respiratory failure, LEFT pleural effusion EXAM: PORTABLE CHEST 1 VIEW COMPARISON:  Portable exam 0448 hours compared to 01/07/2016 FINDINGS: Tip of endotracheal tube projects 4.0 cm above carina. Nasogastric tube extends into stomach. LEFT thoracostomy tube present. LEFT jugular central venous catheter tip projects over SVC. Enlargement of cardiac silhouette. Diffuse RIGHT lung infiltrate little changed. Persistent atelectasis versus consolidation LEFT lower lobe and portion of lingula. Probable small LEFT pleural effusion. No pneumothorax. Bones unremarkable. IMPRESSION: Persistent  significant RIGHT lung infiltrate with dense atelectasis versus consolidation in LEFT lower lobe. Probable LEFT pleural effusion. Overall no significant interval change. Electronically Signed   By: Ulyses SouthwardMark  Boles M.D.   On: 01/08/2016 08:36    STUDIES:  CT chest OSH - multifocal pneumonia with possible early empyema on the left side of the chest.  CT abd/Pelvis OSH - no abnormalities V/Q scan OSH - intermediate probability of PE PCXR 6/21- stable ETT, OGT, L IJ, mod Left pleural effusion, bilateral infiltrates  PCXR 6/22 - stable lines, progression of RLL and LLL infiltrates  CULTURES: 6/21 U Legionella >> negative 6/21 MRSA PCR >> negative 6/21 BC x 2 >> 6/21 U Strep >> negative 6/21 Sputum Cx >> negative   ANTIBIOTICS: Levaquin 500 mg IV daily 6/19 >> 6/21 Meropenum 1 gram IV q 12 6/19 >> 6/21 Vancomycin 1 gram (per pharmacy dosing) 6/20 >> Imipenem 6/21 >>  SIGNIFICANT EVENTS: 6/21 [facility]  6/21 CVTS consult 6/22  CT placed on L 6/23  CT replaced on L  LINES/TUBES: ETT 6/21 >> Foley 6/21 >> L TL IJ 6/21 >> L CT 6/22 >>   DISCUSSION: 70 yo woman with history of GERD, hyperlipidemia, chronic low back pain, hypothyroidism and depression. Ad[facility] for evaluation and treatment of empyema and pneumonia.   ASSESSMENT / PLAN:  PULMONARY A: Empyema Acute Respiratory Failure Pneumonia P:   PRVC 8 cc/kg  Wean PEEP / FiO2 for sats > 92%  Repeat CT imaging of chest given LLL airspace disease despite CT placement  Continue abx as above Case reviewed with TCTS, not candidate for VATS  Trend CXR PS wean as tolerated  CARDIOVASCULAR A:  Hyperlipidemia P:  Atorvastatin 40 mg QHS Maintain MAP > 65  DNR  RENAL A:   Hypokalemia  Pseudo-hypocalcemia - corrects to 9.6 for albumin  P:   Monitor BMP / UOP Replace electrolytes as indicated Goal for euvolemia  GASTROINTESTINAL A:   GERD P:   Pepcid for GI  prophylaxis TF per nutrition    HEMATOLOGIC A:   Anemia DVT prophylaxis P:  SCD's / Heparin for DVT prophylaxis Trend CBC Further anemia workup when stable  INFECTIOUS A:   Leukocytosis - received 5 days atypical coverage Empyema P:   ABX as above Monitor WBC and fever curve Cultures as above  ENDOCRINE A:   Blood glucose monitoring Hypothyroidism P:   SSI CBG Q 4 hours Continue synthroid 50 mcg daily  NEUROLOGIC A:   Sedated Anxiety / Depression  Dementia P:   RASS goal: -1 Fentanyl drip Versed PRN Hold home aricept Continue zoloft  FAMILY  - Updates:  Family updated 6/24 PM  - Inter-disciplinary family meet or Palliative Care meeting due by:  01/11/16   Canary BrimBrandi Ollis, NP-C [place] [facility] Pgr: [phone] or if no answer [phone] 01/08/2016, 9:57 AM   Attending Note:  I have examined patient, reviewed labs, studies and  notes. I have discussed the case with B Ollis, and I agree with the data and plans as amended above. [age] yo woman, admitted for bilateral CAP on 6/16 to OSH. Developed L empyema that required tube drainage. 2nd chest tube placed 6/23. CXR on 6/24 and 6/25 with persistent infiltrates, tube appears to be in good position. On my eval today she is sedated, hemodynamically stable. She has B coarse BS and rhonchi. No air leak on chest tube. Will plan to continue current abx, increase her PEEP to recruit her. Check CT chest to assess degree of consolidation vs effusion. If significant fluid present then will consider t-PA / DNase .Independent critical care time is 36 minutes.   Levy Pupaobert Byrum, MD, PhD 01/08/2016, 1:32 PM [place] [facility] [phone] or if no answer [phone]

## 2016-01-09 ENCOUNTER — Inpatient Hospital Stay (HOSPITAL_COMMUNITY): Payer: Medicare Other

## 2016-01-09 DIAGNOSIS — Z9289 Personal history of other medical treatment: Secondary | ICD-10-CM | POA: Insufficient documentation

## 2016-01-09 DIAGNOSIS — J9600 Acute respiratory failure, unspecified whether with hypoxia or hypercapnia: Secondary | ICD-10-CM

## 2016-01-09 DIAGNOSIS — J189 Pneumonia, unspecified organism: Secondary | ICD-10-CM | POA: Insufficient documentation

## 2016-01-09 LAB — CBC WITH DIFFERENTIAL/PLATELET
BASOS PCT: 0 %
Basophils Absolute: 0 10*3/uL (ref 0.0–0.1)
EOS PCT: 2 %
Eosinophils Absolute: 0.3 10*3/uL (ref 0.0–0.7)
HEMATOCRIT: 24.4 % — AB (ref 36.0–46.0)
Hemoglobin: 7.3 g/dL — ABNORMAL LOW (ref 12.0–15.0)
LYMPHS ABS: 0.6 10*3/uL — AB (ref 0.7–4.0)
Lymphocytes Relative: 4 %
MCH: 28.9 pg (ref 26.0–34.0)
MCHC: 29.9 g/dL — ABNORMAL LOW (ref 30.0–36.0)
MCV: 96.4 fL (ref 78.0–100.0)
MONO ABS: 0.6 10*3/uL (ref 0.1–1.0)
Monocytes Relative: 4 %
NEUTROS ABS: 13.3 10*3/uL — AB (ref 1.7–7.7)
NEUTROS PCT: 90 %
Platelets: 243 10*3/uL (ref 150–400)
RBC: 2.53 MIL/uL — ABNORMAL LOW (ref 3.87–5.11)
RDW: 15.4 % (ref 11.5–15.5)
WBC: 14.8 10*3/uL — ABNORMAL HIGH (ref 4.0–10.5)

## 2016-01-09 LAB — RENAL FUNCTION PANEL
ALBUMIN: 1.5 g/dL — AB (ref 3.5–5.0)
Anion gap: 4 — ABNORMAL LOW (ref 5–15)
BUN: 25 mg/dL — AB (ref 6–20)
CALCIUM: 8 mg/dL — AB (ref 8.9–10.3)
CO2: 33 mmol/L — AB (ref 22–32)
CREATININE: 0.68 mg/dL (ref 0.44–1.00)
Chloride: 112 mmol/L — ABNORMAL HIGH (ref 101–111)
GFR calc Af Amer: >60 mL/min (ref >60)
GFR calc non Af Amer: >60 mL/min (ref >60)
GLUCOSE: 117 mg/dL — AB (ref 65–99)
Phosphorus: 3.8 mg/dL (ref 2.5–4.6)
Potassium: 4.1 mmol/L (ref 3.5–5.1)
SODIUM: 149 mmol/L — AB (ref 135–145)

## 2016-01-09 LAB — CULTURE, BLOOD (ROUTINE X 2)
CULTURE: NO GROWTH
Culture: NO GROWTH

## 2016-01-09 LAB — BASIC METABOLIC PANEL
Anion gap: 4 — ABNORMAL LOW (ref 5–15)
BUN: 26 mg/dL — ABNORMAL HIGH (ref 6–20)
CHLORIDE: 113 mmol/L — AB (ref 101–111)
CO2: 32 mmol/L (ref 22–32)
CREATININE: 0.71 mg/dL (ref 0.44–1.00)
Calcium: 8.1 mg/dL — ABNORMAL LOW (ref 8.9–10.3)
GFR calc non Af Amer: >60 mL/min (ref >60)
GLUCOSE: 127 mg/dL — AB (ref 65–99)
Potassium: 4.1 mmol/L (ref 3.5–5.1)
Sodium: 149 mmol/L — ABNORMAL HIGH (ref 135–145)

## 2016-01-09 LAB — GLUCOSE, CAPILLARY
GLUCOSE-CAPILLARY: 120 mg/dL — AB (ref 65–99)
GLUCOSE-CAPILLARY: 122 mg/dL — AB (ref 65–99)
GLUCOSE-CAPILLARY: 129 mg/dL — AB (ref 65–99)
Glucose-Capillary: 110 mg/dL — ABNORMAL HIGH (ref 65–99)
Glucose-Capillary: 142 mg/dL — ABNORMAL HIGH (ref 65–99)
Glucose-Capillary: 124 mg/dL — ABNORMAL HIGH (ref 65–99)

## 2016-01-09 LAB — MAGNESIUM: Magnesium: 2.8 mg/dL — ABNORMAL HIGH (ref 1.7–2.4)

## 2016-01-09 MED ORDER — FREE WATER
200.0000 mL | Freq: Three times a day (TID) | Status: DC
  Administered 2016-01-09 – 2016-01-10 (×4): 200 mL

## 2016-01-09 MED ORDER — CEFTAZIDIME 2 G IJ SOLR
2.0000 g | Freq: Three times a day (TID) | INTRAMUSCULAR | Status: DC
  Administered 2016-01-09 – 2016-01-10 (×3): 2 g via INTRAVENOUS
  Filled 2016-01-09 (×6): qty 2

## 2016-01-09 MED ORDER — DEXTROSE 5 % IV SOLN
INTRAVENOUS | Status: DC
  Administered 2016-01-09: 08:00:00 via INTRAVENOUS
  Administered 2016-01-10: 50 mL via INTRAVENOUS

## 2016-01-09 MED ORDER — DIPHENHYDRAMINE HCL 50 MG/ML IJ SOLN
50.0000 mg | Freq: Four times a day (QID) | INTRAMUSCULAR | Status: AC
  Administered 2016-01-09 – 2016-01-10 (×4): 50 mg via INTRAVENOUS
  Filled 2016-01-09 (×6): qty 1

## 2016-01-09 NOTE — Progress Notes (Signed)
PULMONARY / CRITICAL CARE MEDICINE   Name: Breanna KosMary Joens MRN: [id number] DOB: 1945/09/05    ADMISSION DATE:  01/13/2016 CONSULTATION DATE:  01/12/2016  CHIEF COMPLAINT:  Treatment for progressing acute respiratory failure, pneumonia and empyema.  HISTORY OF PRESENT ILLNESS:  70 y/o woman with history of GERD, hyperlipidemia, chronic low back pain, hypothyroidism and depression. She was originally seen outpatient on 12/30/15 for severe fatigue, shortness of breath, difficulty taking a deep breath and cough and congestion. She was admitted on 6/16 for inpatient treatment at [facility] and initially treated with Rocephin and Zithromax, however condition continued to worsen. Antibiotics were then changed to Levaquin, Vancomycin and Meropenum. Condition continue to worsen and she required BiPap and transfer to the ICU. Once in the ICU she was then intubated and transferred [facility] for further treatment and workup.   SUBJECTIVE:  rash  VITAL SIGNS: BP 124/62 mmHg  Pulse 70  Temp(Src) 98.3 F (36.8 C) (Oral)  Resp 20  Ht 5\' 7"  (1.702 m)  Wt 92.4 kg (203 lb 11.3 oz)  BMI 31.90 kg/m2  SpO2 96%  HEMODYNAMICS:    VENTILATOR SETTINGS: Vent Mode:  [-] PRVC FiO2 (%):  [80 %-90 %] 80 % Set Rate:  [15 bmp] 15 bmp Vt Set:  [370 mL] 370 mL PEEP:  [8 cmH20-12 cmH20] 12 cmH20 Plateau Pressure:  [29 cmH20-31 cmH20] 29 cmH20  INTAKE / OUTPUT: I/O last 3 completed shifts: In: 3648.7 [I.V.:888.7; NG/GT:1410; IV Piggyback:1350] Out: 2285 [Urine:2235; Chest Tube:50]  PHYSICAL EXAMINATION: General:  Ill looking elderly female on vent Neuro: sedate, no distress.  Opens eyes to voice.   HEENT: Moist mucous membranes, ETT Cardiovascular:  No JVD, S1S2, no murmurs Lungs: ronchil breath sounds on L, CT in place, coarse on R Abdomen: Obese, BS x 4 quadrants, OGT in place Musculoskeletal:  no acute deformities Skin: eyrthema rash abdo chest, blanches easily, itches, slight  raised  LABS:  BMET  Recent Labs Lab 01/07/16 0539 01/08/16 0245 01/09/16 0325  NA 144 145 149*  K 3.8 4.0 4.1  CL 107 110 112*  CO2 30 29 33*  BUN 22* 23* 25*  CREATININE 0.67 0.64 0.68  GLUCOSE 108* 124* 117*   Electrolytes  Recent Labs Lab 01/06/16 0400 01/07/16 0539 01/08/16 0245 01/09/16 0325  CALCIUM 7.9* 8.0* 8.0* 8.0*  MG 2.3 2.2  --  2.8*  PHOS 3.7 5.1*  --  3.8   CBC  Recent Labs Lab 01/07/16 0539 01/08/16 0245 01/09/16 0325  WBC 11.8* 15.1* 14.8*  HGB 7.6* 7.5* 7.3*  HCT 25.4* 25.8* 24.4*  PLT 203 228 243   Coag's  Recent Labs Lab 12/26/2015 1805 01/05/16 0425  APTT 49* 28  INR 1.35 1.32   Sepsis Markers  Recent Labs Lab 12/24/2015 1805 01/05/16 0425 01/06/16 0400  PROCALCITON 1.01 0.74 0.55   ABG  Recent Labs Lab 01/05/16 0335 01/06/16 0333 01/07/16 0414  PHART 7.456* 7.444 7.380  PCO2ART 40.1 41.7 52.3*  PO2ART 81.4 75.1* 65.8*   Liver Enzymes  Recent Labs Lab 12/30/2015 1805 01/09/16 0325  AST 16  --   ALT 12*  --   ALKPHOS 69  --   BILITOT 0.5  --   ALBUMIN 1.9* 1.5*   Cardiac Enzymes No results for input(s): TROPONINI, PROBNP in the last 168 hours.  Glucose  Recent Labs Lab 01/08/16 0754 01/08/16 1209 01/08/16 1631 01/08/16 1932 01/09/16 0002 01/09/16 0322  GLUCAP 123* 132* 123* 131* 120* 110*   Imaging Ct Chest  Wo Contrast  01/08/2016  CLINICAL DATA:  Respiratory failure, pneumonia and status post left chest tube placement for drainage of parapneumonic pleural effusion. EXAM: CT CHEST WITHOUT CONTRAST TECHNIQUE: Multidetector CT imaging of the chest was performed following the standard protocol without IV contrast. COMPARISON:  [facility] on 04-21-2016 FINDINGS: Cardiovascular: The heart is mildly enlarged. No evidence of pericardial effusion. Calcifications are seen associated with the aortic valve. There likely is calcified coronary plaque in the distribution of the  LAD. Left-sided jugular central line extends into the proximal SVC. Mediastinum: An endotracheal tube and gastric decompression tube present. No evidence of mediastinal fluid or mass. No lymphadenopathy identified. Lungs/Pleura: Severe consolidative pneumonia is identified in both lower lobes with associated air bronchograms. Extensive airspace disease also is present in the right middle lobe and right upper lobe. Mild airspace disease is present in the left upper lobe and lingula. Anterior chest tube present at the left lung base. There is no evidence of significant parapneumonic fluid on the left with a small posterior effusion present. Trace pleural fluid is present at the right lung base. No pneumothorax. No evidence of pulmonary abscess or necrosis. Upper Abdomen: The visualized upper abdomen is unremarkable. Musculoskeletal: Bony structures show spondylosis throughout the thoracic spine. No evidence of fracture or bony lesion. IMPRESSION: 1. Extensive pneumonia bilaterally with dense consolidation of both lower lobes and extensive airspace disease in the right middle lobe and right upper lobe. Anterior left chest tube present with no significant parapneumonic fluid on the left. A small posterior left pleural effusion is present. Trace pleural fluid is present on the right. 2. Probable coronary atherosclerosis with calcified plaque in the distribution of the LAD. Electronically Signed   By: Irish LackGlenn  Yamagata M.D.   On: 01/08/2016 11:16    STUDIES:  CT chest OSH - multifocal pneumonia with possible early empyema on the left side of the chest.  CT abd/Pelvis OSH - no abnormalities V/Q scan OSH - intermediate probability of PE PCXR 6/21- stable ETT, OGT, L IJ, mod Left pleural effusion, bilateral infiltrates  PCXR 6/22 - stable lines, progression of RLL and LLL infiltrates Ct chest 6/25>>>resolved effusion, bilateral infiltrates  CULTURES: 6/21 U Legionella >> negative 6/21 MRSA PCR >>  negative 6/21 BC x 2 >> 6/21 U Strep >> negative 6/21 Sputum Cx >> negative   ANTIBIOTICS: Levaquin 500 mg IV daily 6/19 >> 6/21 Meropenum 1 gram IV q 12 6/19 >> 6/21 Vancomycin 1 gram (per pharmacy dosing) 6/20 >>6/26 Imipenem 6/21 >>6/26 ceftaz 6/26>>>  SIGNIFICANT EVENTS: 6/21 [facility]  6/21 CVTS consult 6/22  CT placed on L 6/23  CT replaced on L 6/26- rash  LINES/TUBES: ETT 6/21 >> Foley 6/21 >> L TL IJ 6/21 >> L CT 6/22 >>   DISCUSSION: 70 yo woman with history of GERD, hyperlipidemia, chronic low back pain, hypothyroidism and depression. Ad[facility] for evaluation and treatment of empyema and pneumonia.   ASSESSMENT / PLAN:  PULMONARY A: Empyema resolving Acute Respiratory Failure Pneumonia P:   Peep may need to 14 to get to goal 60% keep same mV CT reviewed, no role DNAs with no effusion present Chest tube off suction, repeat pcxr in 4 hrs Likely should would NOT want trach, will have to discuss  CARDIOVASCULAR A:  Hyperlipidemia P:  Atorvastatin 40 mg QHS Maintain MAP > 60 DNR  RENAL A:   Hypernatremic, hyperchloremia P:   Goal even  Free ater d5w add  GASTROINTESTINAL A:   GERD P:   Pepcid for GI prophylaxis TF per nutrition    HEMATOLOGIC A:   Anemia DVT prophylaxis P:  SCD's / Heparin for DVT prophylaxis Am hct  INFECTIOUS A:   Leukocytosis - received 5 days atypical coverage Empyema, culture neg PNA P:   Narrow off to ceftaz mono  ENDOCRINE A:   Blood glucose monitoring Hypothyroidism P:   SSI CBG Q 4 hours Continue synthroid 50 mcg daily  NEUROLOGIC A:   Sedated Anxiety / Depression  Dementia P:   RASS goal: -1 Fentanyl drip Versed PRN Hold home aricept Continue zoloft  RASH Unclear etiology, doe snot look infectious Add benadryl x 4 doses, re assess May need to dc fentanyl, does not appear typical histamine cause Does not appear satellite lesions or  moisture   FAMILY  - Updates:  none  - Inter-disciplinary family meet or Palliative Care meeting due by:  01/11/16  Ccm time 30 min  Mcarthur Rossetti. Tyson Alias, MD, FACP Pgr: [phone] [place] [facility]

## 2016-01-09 NOTE — Progress Notes (Signed)
eLink Physician-Brief Progress Note Patient Name: Breanna KosMary [name] DOB: 10-18-1945 MRN: [id number]   Date of Service  01/09/2016  HPI/Events of Note  Mild Hypernatremia.  eICU Interventions  Free water 200cc VT q8hr     Intervention Category Intermediate Interventions: Electrolyte abnormality - evaluation and management  Lawanda CousinsJennings Hind Chesler 01/09/2016, 4:31 AM

## 2016-01-09 NOTE — Progress Notes (Signed)
Pharmacy Antibiotic Note  Breanna [name] is a 70 y.o. female admitted on 2016-06-05 with Empyema/PNA.  Pharmacy has been consulted for Ceftazidime dosing (narrowing from Vancomycin/Primaxin).   Ceftazidime 6/26>> Vanco 6/21>>6/26 Primaxin 6/21>>6/26  Plan: -Start Ceftazidime 2g IV q8h -Trend WBC, temp, renal function  -F/U length of treatment  Height: 5\' 7"  (170.2 cm) Weight: 203 lb 11.3 oz (92.4 kg) IBW/kg (Calculated) : 61.6  Temp (24hrs), Avg:98.5 F (36.9 C), Min:98.3 F (36.8 C), Max:99 F (37.2 C)   Recent Labs Lab 01/05/16 0425 01/06/16 0400 01/07/16 0539 01/07/16 1004 01/08/16 0245 01/09/16 0325  WBC 6.8 8.6 11.8*  --  15.1* 14.8*  CREATININE 0.62 0.63 0.67  --  0.64 0.68  VANCOTROUGH  --   --   --  18  --   --     Estimated Creatinine Clearance: 76.3 mL/min (by C-G formula based on Cr of 0.68).    Allergies  Allergen Reactions  . Penicillins     Has patient had a PCN reaction causing immediate rash, facial/tongue/throat swelling, SOB or lightheadedness with hypotension: YES Has patient had a PCN reaction causing severe rash involving mucus membranes or skin necrosis: NO Has patient had a PCN reaction that required hospitalization NO Has patient had a PCN reaction occurring within the last 10 years: NO If all of the above answers are "NO", then may proceed with Cephalosporin use.    Abran DukeLedford, Chessa Barrasso 01/09/2016 6:48 AM

## 2016-01-09 NOTE — Progress Notes (Signed)
25cc Fentanyl gtt wasted in the sink by Stefani DamaKristina Deaundra Dupriest, RN. Witnessed by Tory EmeraldMichelle Toler, RN.

## 2016-01-09 NOTE — Care Management Important Message (Signed)
Important Message  Patient Details  Name: Breanna [name] MRN: [id number] Date of Birth: [birth year]   Medicare Important Message Given:  Yes    Kyla BalzarineShealy, Kristien Salatino Abena 01/09/2016, 10:31 AM

## 2016-01-10 ENCOUNTER — Inpatient Hospital Stay (HOSPITAL_COMMUNITY): Payer: Medicare Other

## 2016-01-10 LAB — BLOOD GAS, ARTERIAL
ACID-BASE EXCESS: 8.2 mmol/L — AB (ref 0.0–2.0)
BICARBONATE: 32.9 meq/L — AB (ref 20.0–24.0)
DRAWN BY: 441661
FIO2: 0.6
LHR: 15 {breaths}/min
MECHVT: 370 mL
O2 Saturation: 94.2 %
PEEP/CPAP: 14 cmH2O
Patient temperature: 99.7
TCO2: 34.5 mmol/L (ref 0–100)
pCO2 arterial: 53.9 mmHg — ABNORMAL HIGH (ref 35.0–45.0)
pH, Arterial: 7.406 (ref 7.350–7.450)
pO2, Arterial: 74.7 mmHg — ABNORMAL LOW (ref 80.0–100.0)

## 2016-01-10 LAB — CBC WITH DIFFERENTIAL/PLATELET
BASOS ABS: 0 10*3/uL (ref 0.0–0.1)
BASOS PCT: 0 %
EOS PCT: 2 %
Eosinophils Absolute: 0.2 10*3/uL (ref 0.0–0.7)
HEMATOCRIT: 23.9 % — AB (ref 36.0–46.0)
HEMOGLOBIN: 7.1 g/dL — AB (ref 12.0–15.0)
LYMPHS ABS: 0.7 10*3/uL (ref 0.7–4.0)
LYMPHS PCT: 6 %
MCH: 28.4 pg (ref 26.0–34.0)
MCHC: 29.7 g/dL — AB (ref 30.0–36.0)
MCV: 95.6 fL (ref 78.0–100.0)
MONOS PCT: 5 %
Monocytes Absolute: 0.6 10*3/uL (ref 0.1–1.0)
NEUTROS ABS: 10.1 10*3/uL — AB (ref 1.7–7.7)
Neutrophils Relative %: 87 %
Platelets: 275 10*3/uL (ref 150–400)
RBC: 2.5 MIL/uL — ABNORMAL LOW (ref 3.87–5.11)
RDW: 15.5 % (ref 11.5–15.5)
WBC: 11.6 10*3/uL — ABNORMAL HIGH (ref 4.0–10.5)

## 2016-01-10 LAB — COMPREHENSIVE METABOLIC PANEL
ALBUMIN: 1.4 g/dL — AB (ref 3.5–5.0)
ALK PHOS: 99 U/L (ref 38–126)
ALT: 23 U/L (ref 14–54)
ANION GAP: 6 (ref 5–15)
AST: 39 U/L (ref 15–41)
BILIRUBIN TOTAL: 0.4 mg/dL (ref 0.3–1.2)
BUN: 24 mg/dL — ABNORMAL HIGH (ref 6–20)
CO2: 32 mmol/L (ref 22–32)
Calcium: 8.1 mg/dL — ABNORMAL LOW (ref 8.9–10.3)
Chloride: 109 mmol/L (ref 101–111)
Creatinine, Ser: 0.72 mg/dL (ref 0.44–1.00)
GFR calc Af Amer: >60 mL/min (ref >60)
Glucose, Bld: 120 mg/dL — ABNORMAL HIGH (ref 65–99)
POTASSIUM: 3.8 mmol/L (ref 3.5–5.1)
Sodium: 147 mmol/L — ABNORMAL HIGH (ref 135–145)
TOTAL PROTEIN: 6.2 g/dL — AB (ref 6.5–8.1)

## 2016-01-10 LAB — GLUCOSE, CAPILLARY
GLUCOSE-CAPILLARY: 113 mg/dL — AB (ref 65–99)
Glucose-Capillary: 115 mg/dL — ABNORMAL HIGH (ref 65–99)
Glucose-Capillary: 120 mg/dL — ABNORMAL HIGH (ref 65–99)

## 2016-01-10 MED ORDER — SODIUM CHLORIDE 0.9 % IV SOLN
5.0000 mg/h | INTRAVENOUS | Status: DC
  Administered 2016-01-10: 10 mg/h via INTRAVENOUS
  Filled 2016-01-10 (×2): qty 10

## 2016-01-10 MED ORDER — FENTANYL CITRATE (PF) 100 MCG/2ML IJ SOLN
INTRAMUSCULAR | Status: AC
  Filled 2016-01-10: qty 2

## 2016-01-10 MED ORDER — LORAZEPAM 2 MG/ML IJ SOLN
1.0000 mg | INTRAMUSCULAR | Status: DC | PRN
  Administered 2016-01-10 (×2): 1 mg via INTRAVENOUS
  Filled 2016-01-10 (×2): qty 1

## 2016-01-10 MED ORDER — FENTANYL CITRATE (PF) 100 MCG/2ML IJ SOLN
100.0000 ug | Freq: Once | INTRAMUSCULAR | Status: AC
  Administered 2016-01-10: 100 ug via INTRAVENOUS

## 2016-01-10 MED ORDER — LORAZEPAM 2 MG/ML IJ SOLN
2.0000 mg | INTRAMUSCULAR | Status: DC | PRN
Start: 2016-01-10 — End: 2016-01-11
  Administered 2016-01-10: 2 mg via INTRAVENOUS
  Filled 2016-01-10: qty 1

## 2016-01-10 MED ORDER — DEXMEDETOMIDINE HCL IN NACL 200 MCG/50ML IV SOLN
0.4000 ug/kg/h | INTRAVENOUS | Status: DC
  Filled 2016-01-10: qty 50

## 2016-01-14 NOTE — Procedures (Signed)
Extubation Procedure Note  Patient Details:   Name: Morley KosMary Nicoletti DOB: 01/26/1946 MRN: [id number]   Airway Documentation:     Evaluation  Pt terminally extubated   Armando GangMike, Jilda Kress C 02/29/2016, 2:19 PM

## 2016-01-14 NOTE — Discharge Summary (Signed)
[name], Breanna [name]                 ACCOUNT NO.:  [id number]  MEDICAL RECORD NO.:  [id number]  LOCATION:  2M08C                        FACILITY:  [facility]  PHYSICIAN:  Nelda Bucksaniel J Feinstein, MD DATE OF BIRTH:  September 22, 1945  DATE OF ADMISSION:  12/31/2015 DATE OF DISCHARGE:  20-Jan-2016                              DISCHARGE SUMMARY   DEATH SUMMARY  A 70 year old white female with history of GERD, hyperlipidemia, chronic lower back pain, hypothyroidism, depression with a very poor functional status at home, poor mobility, who essentially was admitted and treated for empyema which resolved with chest tube, was not a candidate for VATS, readmitted on 06/16 with inpatient treatment and __________ Hospital and treated with Rocephin and Zithromax, however condition worsens, [facility] therapy was treated with Levaquin, vanco, and meropenem.  Worsened with bilateral infiltrates and dense consolidations consistent with ARDS or pneumonia. The patient did not make significant progress.  Family decided for comfort care.  Comfort care was provided.  The patient expired.  FINAL DIAGNOSES UPON DEATH: 1. ARDS. 2. Bilateral pneumonia. 3. Empyema. 4. Acute respiratory failure.     Nelda Bucksaniel J Feinstein, MD     DJF/MEDQ  D:  01/11/2016  T:  01/12/2016  Job:  [id number]

## 2016-01-14 NOTE — Progress Notes (Signed)
Patient cardiac arrest at [id number].  2 RNs Eddie NorthJennifer Leilene Diprima, RN and Janice Coffinyesha Harvey, RN verified cessation of heart rate and lung sounds. Dr. Sung AmabileSimonds Notified. Family at bedside.  Wasted 75 cc of Morphine, in sink witnessed by Windy FastMarybeth Hancock, RN.

## 2016-01-14 NOTE — Progress Notes (Signed)
PULMONARY / CRITICAL CARE MEDICINE   Name: Breanna KosMary [name] MRN: [id number] DOB: [birth year]    ADMISSION DATE:  01/03/2016 CONSULTATION DATE:  01/06/2016  CHIEF COMPLAINT:  Treatment for progressing acute respiratory failure, pneumonia and empyema.  HISTORY OF PRESENT ILLNESS:  70 y/o woman with history of GERD, hyperlipidemia, chronic low back pain, hypothyroidism and depression. She was originally seen outpatient on 12/30/15 for severe fatigue, shortness of breath, difficulty taking a deep breath and cough and congestion. She was admitted on 6/16 for inpatient [facility] and initially treated with Rocephin and Zithromax, however condition continued to worsen. Antibiotics were then changed to Levaquin, Vancomycin and Meropenum. Condition continue to worsen and she required BiPap and transfer to the ICU. Once in the ICU she was then intubated and transferred [facility] further treatment and workup.   SUBJECTIVE: restless today   VITAL SIGNS: BP 139/61 mmHg  Pulse 63  Temp(Src) 99.7 F (37.6 C) (Oral)  Resp 29  Ht 5\' 7"  (1.702 m)  Wt 210 lb 15.7 oz (95.7 kg)  BMI 33.04 kg/m2  SpO2 96%  HEMODYNAMICS:    VENTILATOR SETTINGS: Vent Mode:  [-] PRVC FiO2 (%):  [60 %-80 %] 60 % Set Rate:  [15 bmp] 15 bmp Vt Set:  [370 mL] 370 mL PEEP:  [14 cmH20-15 cmH20] 14 cmH20 Plateau Pressure:  [11 cmH20-29 cmH20] 29 cmH20  INTAKE / OUTPUT: I/O last 3 completed shifts: In: 3853.8 [I.V.:1478.8; Other:175; NG/GT:1500; IV Piggyback:700] Out: 2266 [Urine:2260; Chest Tube:6]  PHYSICAL EXAMINATION: General:  Ill looking elderly female on vent Neuro: restless Opens eyes to voice, FC x 4 extremities. PERRL   HEENT: Moist mucous membranes, ETT Cardiovascular:  No JVD, S1S2, no murmurs Lungs: ronchi breath sounds on L, CT in place, coarse on R Abdomen: Obese, BS x 4 quadrants, OGT in place Musculoskeletal:  no acute deformities Skin: eyrthema rash abdo chest, blanches easily,  itches, slight raised  LABS:  BMET  Recent Labs Lab 01/09/16 0325 01/09/16 1608 12-08-15 0540  NA 149* 149* 147*  K 4.1 4.1 3.8  CL 112* 113* 109  CO2 33* 32 32  BUN 25* 26* 24*  CREATININE 0.68 0.71 0.72  GLUCOSE 117* 127* 120*   Electrolytes  Recent Labs Lab 01/06/16 0400 01/07/16 0539  01/09/16 0325 01/09/16 1608 12-08-15 0540  CALCIUM 7.9* 8.0*  < > 8.0* 8.1* 8.1*  MG 2.3 2.2  --  2.8*  --   --   PHOS 3.7 5.1*  --  3.8  --   --   < > = values in this interval not displayed. CBC  Recent Labs Lab 01/08/16 0245 01/09/16 0325 12-08-15 0540  WBC 15.1* 14.8* 11.6*  HGB 7.5* 7.3* 7.1*  HCT 25.8* 24.4* 23.9*  PLT 228 243 275   Coag's  Recent Labs Lab 01/06/2016 1805 01/05/16 0425  APTT 49* 28  INR 1.35 1.32   Sepsis Markers  Recent Labs Lab 12/15/2015 1805 01/05/16 0425 01/06/16 0400  PROCALCITON 1.01 0.74 0.55   ABG  Recent Labs Lab 01/06/16 0333 01/07/16 0414 12-08-15 0615  PHART 7.444 7.380 7.406  PCO2ART 41.7 52.3* 53.9*  PO2ART 75.1* 65.8* 74.7*   Liver Enzymes  Recent Labs Lab 01/03/2016 1805 01/09/16 0325 12-08-15 0540  AST 16  --  39  ALT 12*  --  23  ALKPHOS 69  --  99  BILITOT 0.5  --  0.4  ALBUMIN 1.9* 1.5* 1.4*   Cardiac Enzymes No results for input(s):  TROPONINI, PROBNP in the last 168 hours.  Glucose  Recent Labs Lab 01/09/16 0740 01/09/16 1145 01/09/16 1607 01/09/16 2008 12/27/2015 0039 01/01/2016 0437  GLUCAP 142* 122* 129* 124* 115* 120*   Imaging Dg Chest Port 1 View  12/31/2015  CLINICAL DATA:  Hypoxia EXAM: PORTABLE CHEST 1 VIEW COMPARISON:  January 09, 2016 FINDINGS: Endotracheal tube tip is 2.8 cm above the carina. Central catheter tip is at the junction of the left innominate vein and superior vena cava. Nasogastric tube tip and side port are in the stomach. Chest tube is noted on the left. No demonstrable pneumothorax. There remains mild cardiac enlargement. There is interstitial edema with small left  pleural effusion. There is left lower lobe atelectatic change. Pulmonary vascularity suggests a degree of pulmonary venous hypertension. IMPRESSION: Tube and catheter positions as described without pneumothorax. Evidence of a degree of congestive heart failure. There is left lower lobe atelectasis. A degree of superimposed pneumonia in the left base region cannot be excluded. No new opacity evident. Electronically Signed   By: Bretta Bang III M.D.   On: 12/29/2015 07:21    STUDIES:  CT chest OSH - multifocal pneumonia with possible early empyema on the left side of the chest.  CT abd/Pelvis OSH - no abnormalities V/Q scan OSH - intermediate probability of PE PCXR 6/21- stable ETT, OGT, L IJ, mod Left pleural effusion, bilateral infiltrates  PCXR 6/22 - stable lines, progression of RLL and LLL infiltrates Ct chest 6/25>>>resolved effusion, bilateral infiltrates  CULTURES: 6/21 U Legionella >> negative 6/21 MRSA PCR >> negative 6/21 BC x 2 >> NGTD 6/21 U Strep >> negative 6/21 Sputum Cx >> negative   ANTIBIOTICS: Levaquin 500 mg IV daily 6/19 >> 6/21 Meropenum 1 gram IV q 12 6/19 >> 6/21 Vancomycin 1 gram (per pharmacy dosing) 6/20 >>6/26 Imipenem 6/21 >>6/26 ceftaz 6/26>>>6/27  SIGNIFICANT EVENTS: 6/21 [facility]  6/21 CVTS consult 6/22  CT placed on L 6/23  CT replaced on L 6/26- rash 6/27: Family meeting scheduled  LINES/TUBES: ETT 6/21 >> Foley 6/21 >> L TL IJ 6/21 >> L CT 6/22 >>   DISCUSSION: 70 yo woman with history of GERD, hyperlipidemia, chronic low back pain, hypothyroidism and depression. Admitted to [facility] for evaluation and treatment of empyema and pneumonia. Not getting better. Pt's family request extubation and transition to comfort.   ASSESSMENT / PLAN:   Acute Respiratory Failure in setting of CAP and associated empyema and ARDS Failure to wean/ventilator dependence  FTT Hyperlipidemia    Hypernatremia hyperchloremia GERD  Anemia DVT prophylaxis Leukocytosis  Empyema, culture neg PNA Blood glucose monitoring Hypothyroidism Sedated Anxiety / Depression  Dementia RASH  Discussion No improvement, remains ventilator dependent. She is deconditioned and debilitated at baseline. Had a long d/w her husband and children. They voice that she would not accept prolonged ventilator dependence, trach or possibility of long term debilitation which she would almost certainly require. Based on this they have all agreed and requested that we transition to comfort.   Plan Start morphine gtt PRN ativan Terminal wean Liberalize visiting hours Cont supportive care.   45 minutes time dedicated to goals of care and formulating plan   [name] ACNP-BC [facility] Pulmonary/Critical Care Pager # [phone] OR # [phone] if no answer   STAFF NOTE: I, Rory Percy, MD FACP have personally reviewed patient's available data, including medical history, events of note, physical examination and test results as part of my evaluation.  I have discussed with resident/NP and other care providers such as pharmacist, RN and RRT. In addition, I personally evaluated patient and elicited key findings of: ronchi bilateral still with increase O2 needs, worsening ards, infiltrates severe, her functional status is poor at baseline, keep ct in until off pos pressore with peep needs high, she is unlikely to recover with any quality of life, she would need trach etc, she wouldwant this under these circumstances, will opt for comfort care per family discussions, will assess c pap ps needs on morphine and 30 secs of pos pressure and tartrate for comfort. The patient is critically ill with multiple organ systems failure and requires high complexity decision making for assessment and support, frequent evaluation and titration of therapies, application of advanced monitoring technologies and extensive  interpretation of multiple databases.   Critical Care Time devoted to patient care services described in this note is 35 Minutes. This time reflects time of care of this signee: Rory Percyaniel Dosia Yodice, MD FACP. This critical care time does not reflect procedure time, or teaching time or supervisory time of PA/NP/Med student/Med Resident etc but could involve care discussion time. Rest per NP/medical resident whose note is outlined above and that I agree with   Mcarthur Rossettianiel J. Tyson AliasFeinstein, MD, FACP Pgr: [phone] [place] [facility] 12/22/2015 10:42 AM

## 2016-01-14 DEATH — deceased

## 2016-01-18 ENCOUNTER — Telehealth: Payer: Self-pay

## 2016-01-18 NOTE — Telephone Encounter (Signed)
On 01/16/2016 I received a death certificate from [employer] (original). The death certificate is for burial. The patient is a patient of Doctor Tyson AliasFeinstein. The death certificate will be [facility] to [facility] (2[facility]) this am for signature. On 01/16/2016 I received the death certificate back from Doctor Tyson AliasFeinstein. I got the death certificate ready and called the funeral home to let them know the death certificate is ready for pickup.

## 2016-01-19 ENCOUNTER — Telehealth: Payer: Self-pay

## 2016-01-19 NOTE — Telephone Encounter (Signed)
On 01/19/2016 I received a death certificate from [facility] [employer] (original). The death certificate is for burial. The patient is a patient of Doctor Tyson AliasFeinstein. Due to the funeral home's error another death certificate needs to be signed because the first one was signed that had copy on the page. The [facility] certificate will be take [facility](2H[facility] this pm for signature. On 01/24/2016 I received the death certificate back from Doctor Byrum who signed the death certificate for Doctor Tyson AliasFeinstein. I called the funeral home to let them know I mailed the [facility] certificate [facility] per their request.

## 2018-03-28 IMAGING — CR DG CHEST 1V PORT
1 series · 1 of 1 positions shown · non-contrast
Comparison: 01/08/2016

CLINICAL DATA: Pneumonia

EXAM:
PORTABLE CHEST 1 VIEW

[AP]
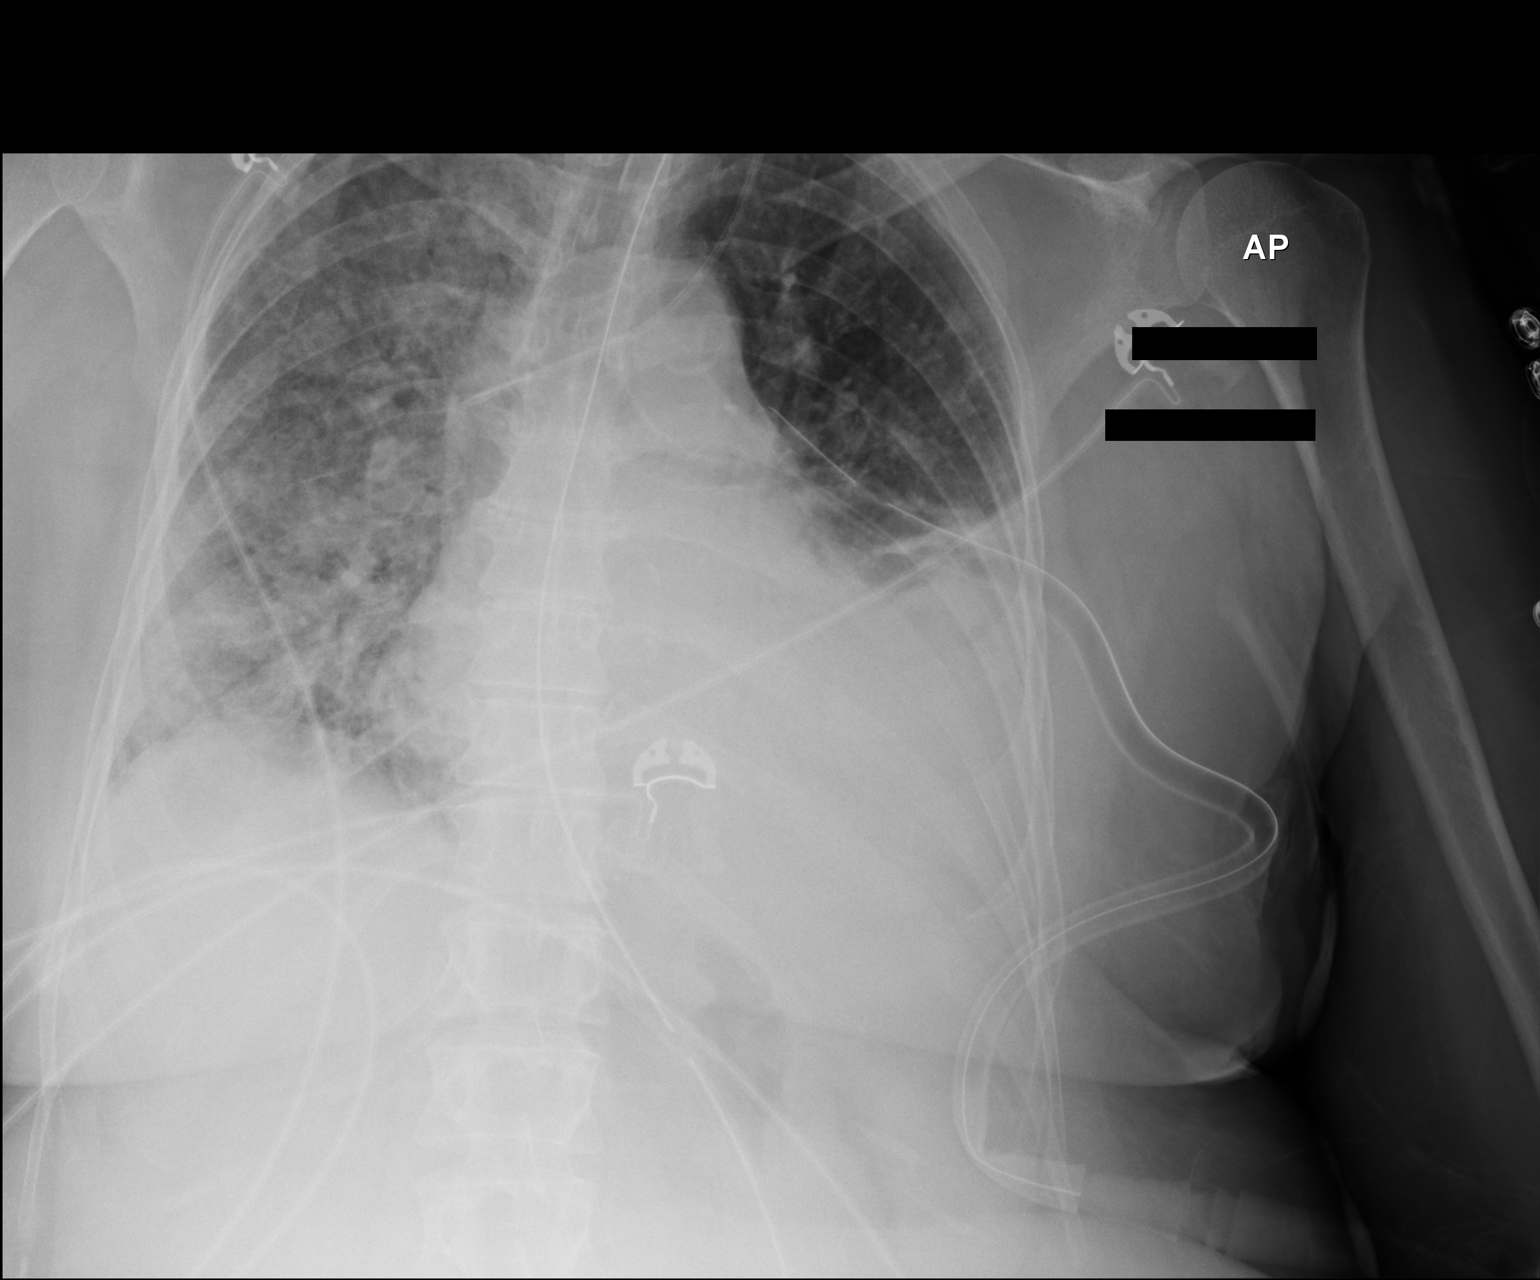

[1 of 1 positions shown; findings below may reference images not displayed]

FINDINGS: Support devices including left chest tube and endotracheal tube are
unchanged. No pneumothorax. Diffuse right lung airspace disease and
left basilar atelectasis or infiltrate with left effusion, stable.
Mild cardiomegaly.
IMPRESSION: No significant change since prior study.
